# Patient Record
Sex: Male | Born: 1960 | Race: Black or African American | Hispanic: No | Marital: Single | Smoking: Former smoker
Health system: Southern US, Community
[De-identification: ages and names within clinical notes are randomized; demographics above are authoritative.]

## PROBLEM LIST (undated history)

## (undated) DIAGNOSIS — I809 Phlebitis and thrombophlebitis of unspecified site: Secondary | ICD-10-CM

---

## 2002-02-02 ENCOUNTER — Encounter: Payer: Self-pay | Admitting: Emergency Medicine

## 2002-02-02 ENCOUNTER — Emergency Department (HOSPITAL_COMMUNITY): Admission: EM | Admit: 2002-02-02 | Discharge: 2002-02-02 | Payer: Self-pay | Admitting: Emergency Medicine

## 2008-12-13 ENCOUNTER — Emergency Department (HOSPITAL_COMMUNITY): Admission: EM | Admit: 2008-12-13 | Discharge: 2008-12-13 | Payer: Self-pay | Admitting: Emergency Medicine

## 2009-08-28 ENCOUNTER — Emergency Department (HOSPITAL_COMMUNITY): Admission: EM | Admit: 2009-08-28 | Discharge: 2009-08-28 | Payer: Self-pay | Admitting: Emergency Medicine

## 2010-04-20 ENCOUNTER — Emergency Department (HOSPITAL_COMMUNITY)
Admission: EM | Admit: 2010-04-20 | Discharge: 2010-04-20 | Payer: Self-pay | Source: Home / Self Care | Admitting: Emergency Medicine

## 2010-10-09 LAB — WOUND CULTURE

## 2010-10-29 LAB — CULTURE, ROUTINE-ABSCESS

## 2011-02-23 ENCOUNTER — Emergency Department (HOSPITAL_COMMUNITY)
Admission: EM | Admit: 2011-02-23 | Discharge: 2011-02-23 | Disposition: A | Discharge status: Home / Self Care | Payer: Self-pay | Attending: Emergency Medicine | Admitting: Emergency Medicine

## 2011-02-23 ENCOUNTER — Emergency Department (HOSPITAL_COMMUNITY): Payer: Self-pay

## 2011-02-23 DIAGNOSIS — L02219 Cutaneous abscess of trunk, unspecified: Secondary | ICD-10-CM | POA: Insufficient documentation

## 2011-02-23 DIAGNOSIS — L03319 Cellulitis of trunk, unspecified: Secondary | ICD-10-CM | POA: Insufficient documentation

## 2011-02-23 DIAGNOSIS — N509 Disorder of male genital organs, unspecified: Secondary | ICD-10-CM | POA: Insufficient documentation

## 2011-02-23 DIAGNOSIS — R609 Edema, unspecified: Secondary | ICD-10-CM | POA: Insufficient documentation

## 2011-02-23 LAB — CBC
MCHC: 34.7 g/dL (ref 30.0–36.0)
Platelets: 261 10*3/uL (ref 150–400)
RDW: 13.1 % (ref 11.5–15.5)

## 2011-02-23 LAB — POCT I-STAT, CHEM 8
BUN: 17 mg/dL (ref 6–23)
Calcium, Ion: 1.1 mmol/L — ABNORMAL LOW (ref 1.12–1.32)
Glucose, Bld: 107 mg/dL — ABNORMAL HIGH (ref 70–99)
HCT: 43 % (ref 39.0–52.0)
Hemoglobin: 14.6 g/dL (ref 13.0–17.0)
Sodium: 136 mEq/L (ref 135–145)
TCO2: 24 mmol/L (ref 0–100)

## 2011-02-23 LAB — DIFFERENTIAL
Basophils Absolute: 0.1 10*3/uL (ref 0.0–0.1)
Basophils Relative: 0 % (ref 0–1)
Eosinophils Absolute: 0.1 10*3/uL (ref 0.0–0.7)
Lymphocytes Relative: 17 % (ref 12–46)
Lymphs Abs: 2.8 10*3/uL (ref 0.7–4.0)
Monocytes Absolute: 1.4 10*3/uL — ABNORMAL HIGH (ref 0.1–1.0)
Monocytes Relative: 8 % (ref 3–12)
Neutro Abs: 12 10*3/uL — ABNORMAL HIGH (ref 1.7–7.7)
Neutrophils Relative %: 74 % (ref 43–77)

## 2011-02-23 MED ORDER — IOHEXOL 300 MG/ML  SOLN
100.0000 mL | Freq: Once | INTRAMUSCULAR | Status: AC | PRN
  Administered 2011-02-23: 100 mL via INTRAVENOUS

## 2011-09-08 ENCOUNTER — Ambulatory Visit
Admission: RE | Admit: 2011-09-08 | Discharge: 2011-09-08 | Disposition: A | Discharge status: Home / Self Care | Payer: No Typology Code available for payment source | Source: Ambulatory Visit | Attending: Orthopedic Surgery | Admitting: Orthopedic Surgery

## 2011-09-08 ENCOUNTER — Other Ambulatory Visit: Payer: Self-pay | Admitting: Orthopedic Surgery

## 2011-09-08 DIAGNOSIS — M2460 Ankylosis, unspecified joint: Secondary | ICD-10-CM

## 2011-09-08 DIAGNOSIS — M25562 Pain in left knee: Secondary | ICD-10-CM

## 2011-09-08 DIAGNOSIS — M79671 Pain in right foot: Secondary | ICD-10-CM

## 2013-04-14 IMAGING — CR DG FOOT COMPLETE 3+V*R*
3 series · 3 of 3 positions shown · non-contrast
Comparison: None.

CLINICAL DATA: Right ankle and foot pain.  Remote history of
trauma.

RIGHT FOOT COMPLETE - 3+ VIEW

[t foot ap right]
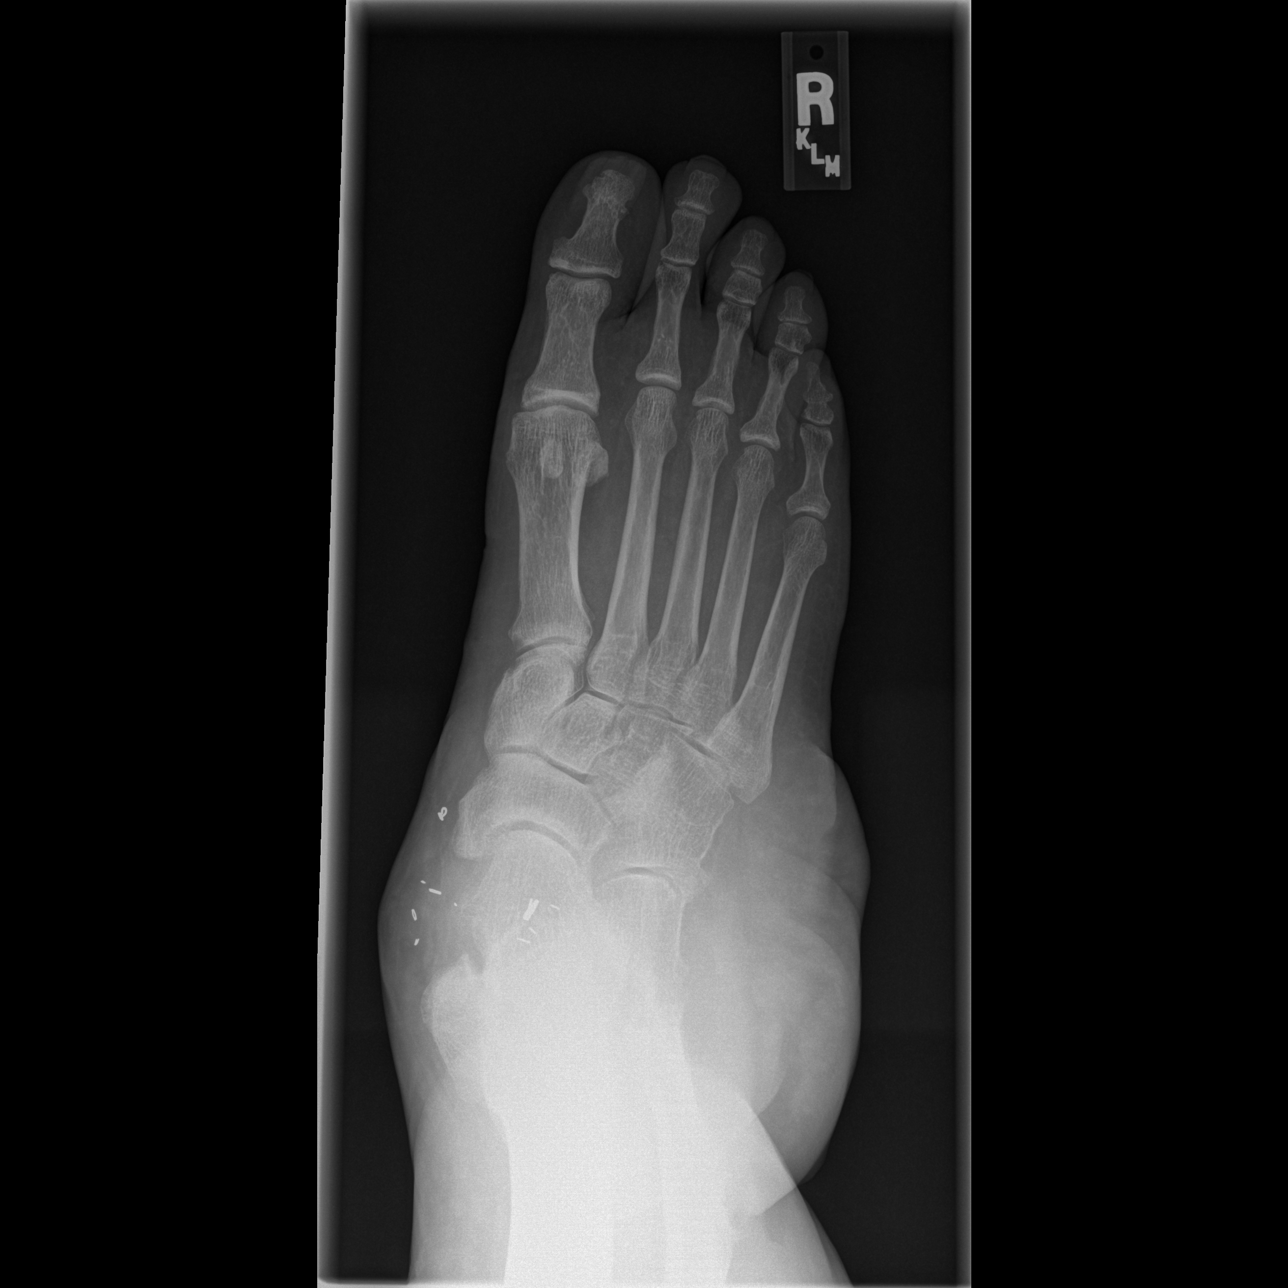

[t foot oblique right]
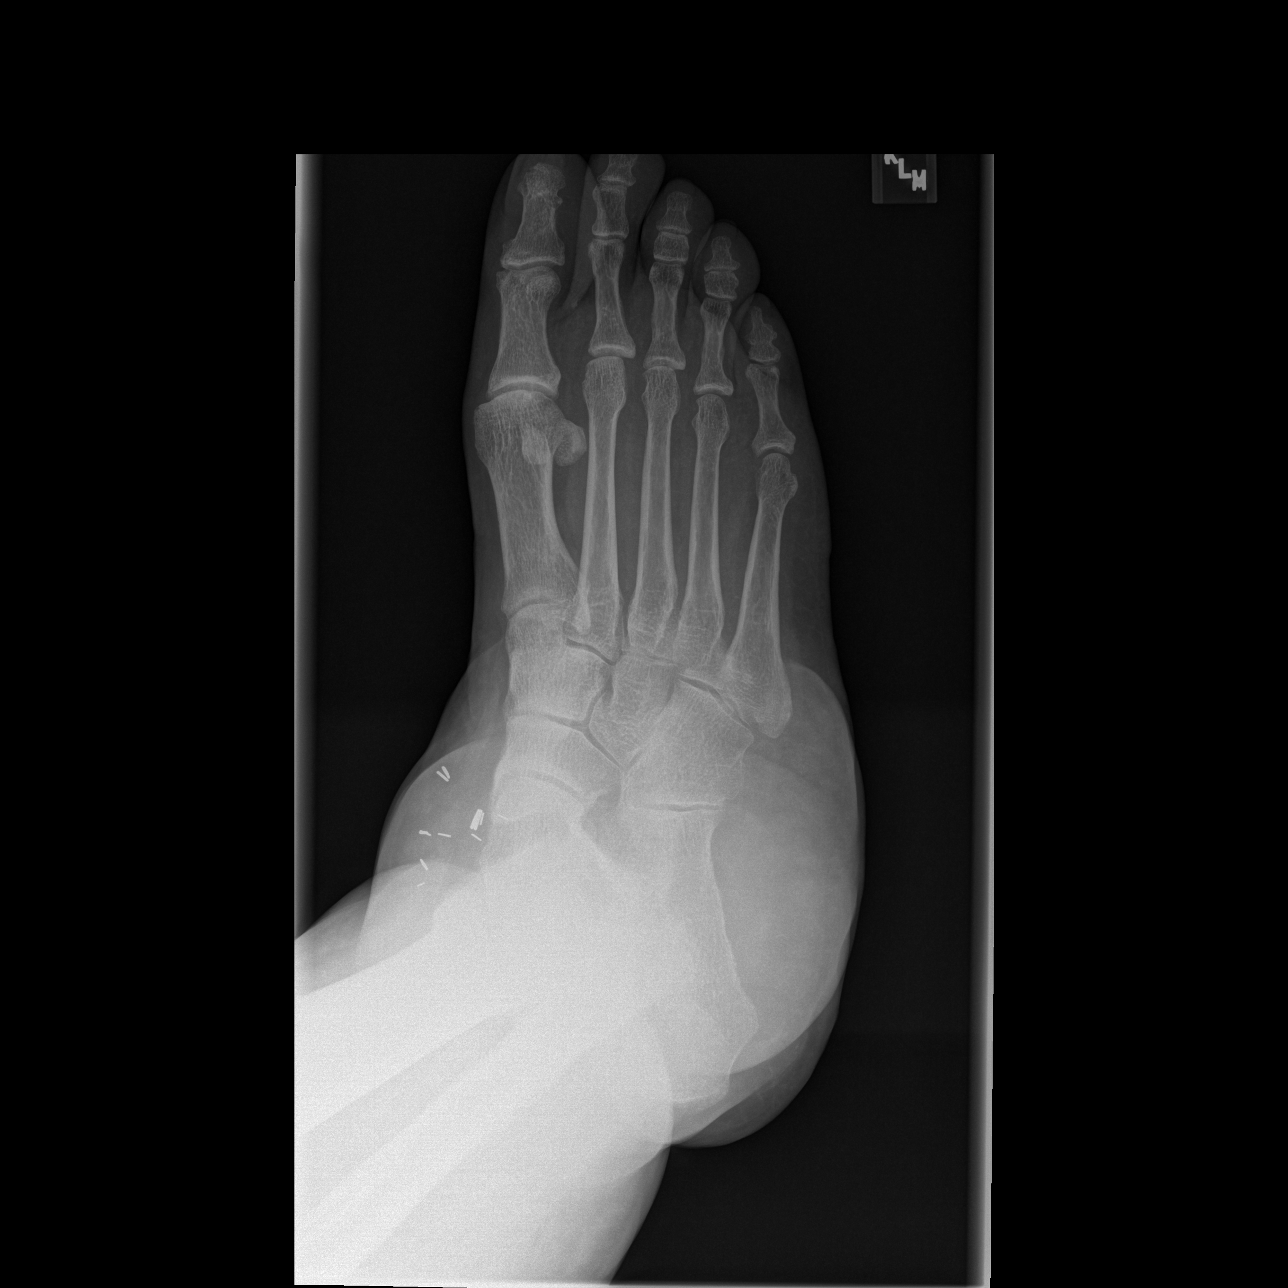

[t foot lat right]
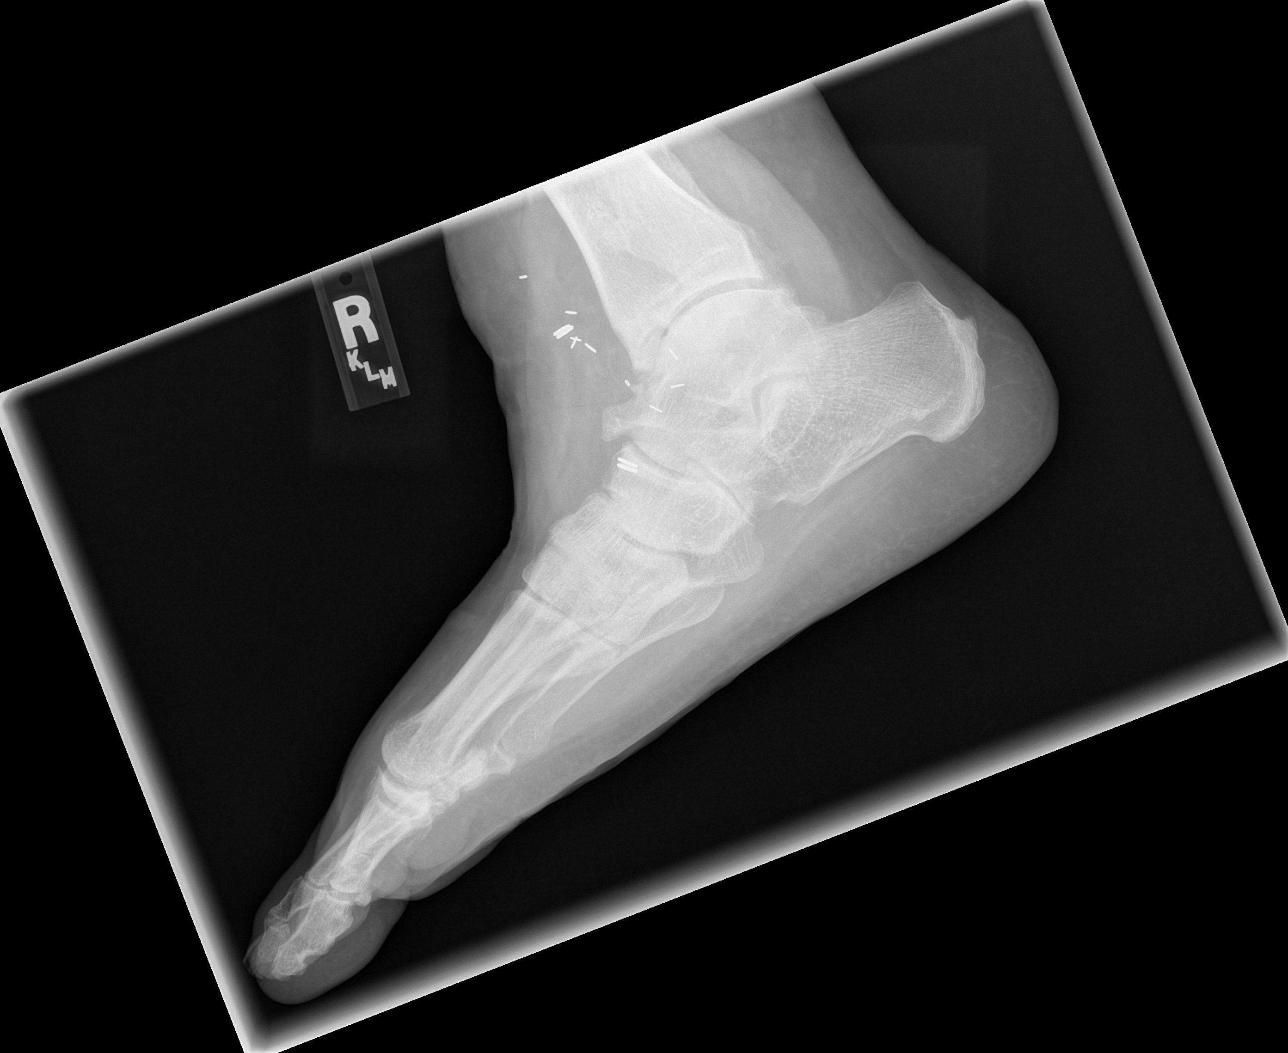

[3 of 3 positions shown; findings below may reference images not displayed]

FINDINGS: Surgical clips about the right ankle.  Post-traumatic
osteoarthritis within the right ankle.  Mild osteoarthritis in the
hind foot.  Mild degenerative changes in the first MTP joint. No
acute bony abnormality.  Specifically, no fracture, subluxation, or
dislocation.  Soft tissues are intact.
IMPRESSION: No acute bony abnormality.

## 2021-12-22 ENCOUNTER — Emergency Department (HOSPITAL_COMMUNITY)
Admission: EM | Admit: 2021-12-22 | Discharge: 2021-12-22 | Disposition: A | Discharge status: Home / Self Care | Payer: Medicaid - Out of State | Attending: Emergency Medicine | Admitting: Emergency Medicine

## 2021-12-22 ENCOUNTER — Encounter (HOSPITAL_COMMUNITY): Payer: Self-pay

## 2021-12-22 ENCOUNTER — Other Ambulatory Visit: Payer: Self-pay

## 2021-12-22 DIAGNOSIS — F10120 Alcohol abuse with intoxication, uncomplicated: Secondary | ICD-10-CM | POA: Insufficient documentation

## 2021-12-22 DIAGNOSIS — R7401 Elevation of levels of liver transaminase levels: Secondary | ICD-10-CM | POA: Insufficient documentation

## 2021-12-22 DIAGNOSIS — L22 Diaper dermatitis: Secondary | ICD-10-CM | POA: Insufficient documentation

## 2021-12-22 DIAGNOSIS — F1410 Cocaine abuse, uncomplicated: Secondary | ICD-10-CM | POA: Diagnosis not present

## 2021-12-22 DIAGNOSIS — R600 Localized edema: Secondary | ICD-10-CM | POA: Insufficient documentation

## 2021-12-22 DIAGNOSIS — F10920 Alcohol use, unspecified with intoxication, uncomplicated: Secondary | ICD-10-CM

## 2021-12-22 DIAGNOSIS — B3789 Other sites of candidiasis: Secondary | ICD-10-CM

## 2021-12-22 DIAGNOSIS — Y908 Blood alcohol level of 240 mg/100 ml or more: Secondary | ICD-10-CM | POA: Insufficient documentation

## 2021-12-22 DIAGNOSIS — R4182 Altered mental status, unspecified: Secondary | ICD-10-CM | POA: Diagnosis not present

## 2021-12-22 LAB — URINALYSIS, ROUTINE W REFLEX MICROSCOPIC
Bilirubin Urine: NEGATIVE
Glucose, UA: NEGATIVE mg/dL
Hgb urine dipstick: NEGATIVE
Ketones, ur: NEGATIVE mg/dL
Leukocytes,Ua: NEGATIVE
Nitrite: NEGATIVE
Protein, ur: NEGATIVE mg/dL
Specific Gravity, Urine: 1.021 (ref 1.005–1.030)
pH: 5 (ref 5.0–8.0)

## 2021-12-22 LAB — CBC WITH DIFFERENTIAL/PLATELET
Abs Immature Granulocytes: 0.03 10*3/uL (ref 0.00–0.07)
Basophils Absolute: 0.1 10*3/uL (ref 0.0–0.1)
Basophils Relative: 1 %
Eosinophils Absolute: 0.1 10*3/uL (ref 0.0–0.5)
Eosinophils Relative: 1 %
HCT: 46.1 % (ref 39.0–52.0)
Hemoglobin: 15.2 g/dL (ref 13.0–17.0)
Immature Granulocytes: 1 %
Lymphocytes Relative: 30 %
Lymphs Abs: 1.4 10*3/uL (ref 0.7–4.0)
MCH: 31.8 pg (ref 26.0–34.0)
MCHC: 33 g/dL (ref 30.0–36.0)
MCV: 96.4 fL (ref 80.0–100.0)
Monocytes Absolute: 0.4 10*3/uL (ref 0.1–1.0)
Monocytes Relative: 8 %
Neutro Abs: 2.8 10*3/uL (ref 1.7–7.7)
Neutrophils Relative %: 59 %
Platelets: 265 10*3/uL (ref 150–400)
RBC: 4.78 MIL/uL (ref 4.22–5.81)
RDW: 16.1 % — ABNORMAL HIGH (ref 11.5–15.5)
WBC: 4.7 10*3/uL (ref 4.0–10.5)
nRBC: 0 % (ref 0.0–0.2)

## 2021-12-22 LAB — COMPREHENSIVE METABOLIC PANEL
ALT: 90 U/L — ABNORMAL HIGH (ref 0–44)
AST: 132 U/L — ABNORMAL HIGH (ref 15–41)
Albumin: 3.8 g/dL (ref 3.5–5.0)
Alkaline Phosphatase: 58 U/L (ref 38–126)
Anion gap: 11 (ref 5–15)
BUN: 7 mg/dL — ABNORMAL LOW (ref 8–23)
CO2: 27 mmol/L (ref 22–32)
Calcium: 8.8 mg/dL — ABNORMAL LOW (ref 8.9–10.3)
Chloride: 106 mmol/L (ref 98–111)
Creatinine, Ser: 0.85 mg/dL (ref 0.61–1.24)
GFR, Estimated: >60 mL/min (ref >60)
Glucose, Bld: 84 mg/dL (ref 70–99)
Potassium: 4.2 mmol/L (ref 3.5–5.1)
Sodium: 144 mmol/L (ref 135–145)
Total Bilirubin: 1.2 mg/dL (ref 0.3–1.2)
Total Protein: 8.2 g/dL — ABNORMAL HIGH (ref 6.5–8.1)

## 2021-12-22 LAB — RAPID URINE DRUG SCREEN, HOSP PERFORMED
Amphetamines: NOT DETECTED
Barbiturates: NOT DETECTED
Benzodiazepines: NOT DETECTED
Cocaine: POSITIVE — AB
Opiates: NOT DETECTED
Tetrahydrocannabinol: NOT DETECTED

## 2021-12-22 LAB — ETHANOL: Alcohol, Ethyl (B): 255 mg/dL — ABNORMAL HIGH (ref <10)

## 2021-12-22 LAB — PROTIME-INR
INR: 1 (ref 0.8–1.2)
Prothrombin Time: 13.4 seconds (ref 11.4–15.2)

## 2021-12-22 LAB — SALICYLATE LEVEL: Salicylate Lvl: <7 mg/dL — ABNORMAL LOW (ref 7.0–30.0)

## 2021-12-22 LAB — ACETAMINOPHEN LEVEL: Acetaminophen (Tylenol), Serum: 23 ug/mL (ref 10–30)

## 2021-12-22 LAB — AMMONIA: Ammonia: 24 umol/L (ref 9–35)

## 2021-12-22 LAB — LIPASE, BLOOD: Lipase: 34 U/L (ref 11–51)

## 2021-12-22 LAB — LACTIC ACID, PLASMA: Lactic Acid, Venous: 3.3 mmol/L (ref 0.5–1.9)

## 2021-12-22 MED ORDER — STERILE WATER FOR INJECTION IJ SOLN
INTRAMUSCULAR | Status: AC
  Filled 2021-12-22: qty 10

## 2021-12-22 MED ORDER — FLUCONAZOLE 100 MG PO TABS: 100.0000 mg | ORAL_TABLET | Freq: Every day | ORAL | 0 refills | Status: AC

## 2021-12-22 MED ORDER — ZIPRASIDONE MESYLATE 20 MG IM SOLR
10.0000 mg | Freq: Once | INTRAMUSCULAR | Status: AC
  Administered 2021-12-22: 10 mg via INTRAMUSCULAR
  Filled 2021-12-22: qty 20

## 2021-12-22 MED ORDER — LACTATED RINGERS IV BOLUS
500.0000 mL | Freq: Once | INTRAVENOUS | Status: AC
  Administered 2021-12-22: 500 mL via INTRAVENOUS

## 2021-12-22 NOTE — ED Triage Notes (Signed)
Patient states that he doesn't know why he came to ED today. per spouse who only dropped off patient he is having groin pain and is an alcoholic. Patient doesn't know why here. Abdomen appears large denies pain to same
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
## 2021-12-22 NOTE — ED Notes (Signed)
Pt denies any complaints. States he has no idea why he is here. Pt admits to drinking etoh today. Denies SI/HI to this RN.

## 2021-12-22 NOTE — ED Notes (Signed)
Provider aware of LA

## 2021-12-22 NOTE — ED Provider Notes (Signed)
Patient is a 61 year old male presenting for an unclear reason, he endorses heavy alcohol use, he has absolutely no complaints, there was a possible report of groin pain however on my exam this patient has normal vital signs a soft nontender abdomen, clear heart and lung sounds, bilateral symmetrical lower extremity edema which she states is chronic and what appears to be a candidal infection in the left inguinal region which is wet nontender and there is no associated lymphadenopathy.  Medical screening examination/treatment/procedure(s) were conducted as a shared visit with non-physician practitioner(s) and myself.  I personally evaluated the patient during the encounter.  Clinical Impression:   Final diagnoses:  None         Eber Hong, MD 12/23/21 1228

## 2021-12-22 NOTE — ED Notes (Signed)
Unable to obtain labs 
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
## 2021-12-22 NOTE — ED Notes (Signed)
Call michele moore when ready for discharged [phone]

## 2021-12-22 NOTE — Discharge Instructions (Addendum)
The rash over your groin is consistent with a yeast infection.  Please start fluconazole, 1 tablet daily for 2 weeks.

## 2022-02-16 ENCOUNTER — Emergency Department (HOSPITAL_BASED_OUTPATIENT_CLINIC_OR_DEPARTMENT_OTHER)
Admission: EM | Admit: 2022-02-16 | Discharge: 2022-02-16 | Discharge status: Against Medical Advice | Payer: Medicaid Other | Attending: Emergency Medicine | Admitting: Emergency Medicine

## 2022-02-16 ENCOUNTER — Ambulatory Visit
Admission: EM | Admit: 2022-02-16 | Discharge: 2022-02-16 | Disposition: A | Discharge status: Home / Self Care | Payer: Medicaid Other

## 2022-02-16 ENCOUNTER — Emergency Department (HOSPITAL_BASED_OUTPATIENT_CLINIC_OR_DEPARTMENT_OTHER): Payer: Medicaid Other

## 2022-02-16 ENCOUNTER — Other Ambulatory Visit: Payer: Self-pay

## 2022-02-16 ENCOUNTER — Ambulatory Visit: Payer: Self-pay

## 2022-02-16 ENCOUNTER — Emergency Department (HOSPITAL_BASED_OUTPATIENT_CLINIC_OR_DEPARTMENT_OTHER): Payer: Medicaid Other | Admitting: Radiology

## 2022-02-16 DIAGNOSIS — Z7901 Long term (current) use of anticoagulants: Secondary | ICD-10-CM | POA: Insufficient documentation

## 2022-02-16 DIAGNOSIS — M86261 Subacute osteomyelitis, right tibia and fibula: Secondary | ICD-10-CM | POA: Diagnosis not present

## 2022-02-16 DIAGNOSIS — I2699 Other pulmonary embolism without acute cor pulmonale: Secondary | ICD-10-CM

## 2022-02-16 DIAGNOSIS — S91301S Unspecified open wound, right foot, sequela: Secondary | ICD-10-CM

## 2022-02-16 DIAGNOSIS — M7989 Other specified soft tissue disorders: Secondary | ICD-10-CM | POA: Diagnosis present

## 2022-02-16 DIAGNOSIS — I872 Venous insufficiency (chronic) (peripheral): Secondary | ICD-10-CM

## 2022-02-16 DIAGNOSIS — I809 Phlebitis and thrombophlebitis of unspecified site: Secondary | ICD-10-CM

## 2022-02-16 HISTORY — DX: Phlebitis and thrombophlebitis of unspecified site: I80.9

## 2022-02-16 LAB — COMPREHENSIVE METABOLIC PANEL
ALT: 24 U/L (ref 0–44)
AST: 28 U/L (ref 15–41)
Albumin: 4.1 g/dL (ref 3.5–5.0)
Alkaline Phosphatase: 56 U/L (ref 38–126)
Anion gap: 10 (ref 5–15)
BUN: 8 mg/dL (ref 8–23)
CO2: 28 mmol/L (ref 22–32)
Calcium: 9.9 mg/dL (ref 8.9–10.3)
Chloride: 107 mmol/L (ref 98–111)
Creatinine, Ser: 0.86 mg/dL (ref 0.61–1.24)
GFR, Estimated: >60 mL/min (ref >60)
Glucose, Bld: 109 mg/dL — ABNORMAL HIGH (ref 70–99)
Potassium: 3.9 mmol/L (ref 3.5–5.1)
Sodium: 145 mmol/L (ref 135–145)
Total Bilirubin: 0.7 mg/dL (ref 0.3–1.2)
Total Protein: 8.5 g/dL — ABNORMAL HIGH (ref 6.5–8.1)

## 2022-02-16 LAB — URINALYSIS, ROUTINE W REFLEX MICROSCOPIC
Bilirubin Urine: NEGATIVE
Glucose, UA: NEGATIVE mg/dL
Hgb urine dipstick: NEGATIVE
Ketones, ur: NEGATIVE mg/dL
Leukocytes,Ua: NEGATIVE
Nitrite: NEGATIVE
Protein, ur: NEGATIVE mg/dL
Specific Gravity, Urine: 1.018 (ref 1.005–1.030)
pH: 6 (ref 5.0–8.0)

## 2022-02-16 LAB — CBC WITH DIFFERENTIAL/PLATELET
Abs Immature Granulocytes: 0.03 10*3/uL (ref 0.00–0.07)
Basophils Absolute: 0.1 10*3/uL (ref 0.0–0.1)
Basophils Relative: 2 %
Eosinophils Absolute: 0.1 10*3/uL (ref 0.0–0.5)
Eosinophils Relative: 3 %
HCT: 44.8 % (ref 39.0–52.0)
Hemoglobin: 14.9 g/dL (ref 13.0–17.0)
Immature Granulocytes: 1 %
Lymphocytes Relative: 41 %
Lymphs Abs: 1.4 10*3/uL (ref 0.7–4.0)
MCH: 31.9 pg (ref 26.0–34.0)
MCHC: 33.3 g/dL (ref 30.0–36.0)
MCV: 95.9 fL (ref 80.0–100.0)
Monocytes Absolute: 0.3 10*3/uL (ref 0.1–1.0)
Monocytes Relative: 9 %
Neutro Abs: 1.6 10*3/uL — ABNORMAL LOW (ref 1.7–7.7)
Neutrophils Relative %: 44 %
Platelets: 301 10*3/uL (ref 150–400)
RBC: 4.67 MIL/uL (ref 4.22–5.81)
RDW: 14.5 % (ref 11.5–15.5)
WBC: 3.4 10*3/uL — ABNORMAL LOW (ref 4.0–10.5)
nRBC: 0 % (ref 0.0–0.2)

## 2022-02-16 LAB — PROTIME-INR
INR: 0.9 (ref 0.8–1.2)
Prothrombin Time: 12.1 seconds (ref 11.4–15.2)

## 2022-02-16 LAB — LACTIC ACID, PLASMA
Lactic Acid, Venous: 1.8 mmol/L (ref 0.5–1.9)
Lactic Acid, Venous: 1.6 mmol/L (ref 0.5–1.9)

## 2022-02-16 MED ORDER — PIPERACILLIN-TAZOBACTAM 3.375 G IVPB
3.3750 g | Freq: Once | INTRAVENOUS | Status: AC
  Administered 2022-02-16: 3.375 g via INTRAVENOUS
  Filled 2022-02-16: qty 50

## 2022-02-16 MED ORDER — DOXYCYCLINE HYCLATE 100 MG PO CAPS: 100.0000 mg | ORAL_CAPSULE | Freq: Two times a day (BID) | ORAL | 0 refills | Status: AC

## 2022-02-16 MED ORDER — SODIUM CHLORIDE 0.9 % IV BOLUS
500.0000 mL | Freq: Once | INTRAVENOUS | Status: AC
  Administered 2022-02-16: 500 mL via INTRAVENOUS

## 2022-02-16 MED ORDER — CEPHALEXIN 500 MG PO CAPS: 500.0000 mg | ORAL_CAPSULE | Freq: Four times a day (QID) | ORAL | 0 refills | Status: AC

## 2022-02-16 MED ORDER — VANCOMYCIN HCL IN DEXTROSE 1-5 GM/200ML-% IV SOLN
1000.0000 mg | Freq: Once | INTRAVENOUS | Status: AC
  Administered 2022-02-16: 1000 mg via INTRAVENOUS
  Filled 2022-02-16: qty 200

## 2022-02-16 NOTE — ED Triage Notes (Signed)
Patient arrives with complaints of worsening right foot wound x1 month. Patient's significant other states that the site has been draining with foul odor.   Patient reports no pain.

## 2022-02-16 NOTE — ED Notes (Signed)
Patient is being discharged from the Urgent Care and sent to the Emergency Department via private vehicle . Per L. Scales PA, patient is in need of higher level of care due to infected wound. Patient is aware and verbalizes understanding of plan of care.  Vitals:   02/16/22 1012  BP: 107/76  Pulse: (!) 107  Resp: 16  Temp: 98.2 F (36.8 C)  SpO2: 97%

## 2022-02-16 NOTE — ED Notes (Signed)
Pts sock removed advanced wound  with 2 open areas that are draining scant amount of yellow exudate. Foot has a foul odor.

## 2022-02-16 NOTE — ED Triage Notes (Signed)
Pt states open wound to rt foot for over a year.
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
## 2022-02-16 NOTE — Discharge Instructions (Addendum)
You were seen in the emergency department today for an infection of a wound on your foot.  You do have an infection of the bone.  You have decided not to stay in the hospital to receive IV antibiotics.  We believe that that is the proper course of treatment for osteomyelitis or an infection of the bone.  Alternatively he would like to leave and receive oral antibiotics.  Again the risks of this include worsening of the infection, infection spreading to your blood with subsequent sepsis, needing amputation of your foot or leg, permanent disability and death.  Please return to the emergency department for any worsening of your symptoms.

## 2022-02-21 LAB — CULTURE, BLOOD (ROUTINE X 2)
Culture: NO GROWTH
Culture: NO GROWTH
Special Requests: ADEQUATE

## 2022-11-17 ENCOUNTER — Ambulatory Visit (INDEPENDENT_AMBULATORY_CARE_PROVIDER_SITE_OTHER): Payer: Medicaid Other | Admitting: Internal Medicine

## 2022-11-17 ENCOUNTER — Other Ambulatory Visit: Payer: Self-pay

## 2022-11-17 ENCOUNTER — Encounter: Payer: Self-pay | Admitting: Internal Medicine

## 2022-11-17 VITALS — BP 155/93 | HR 89 | Temp 98.1°F | Resp 28 | Ht 71.0 in | Wt 240.4 lb

## 2022-11-17 DIAGNOSIS — R030 Elevated blood-pressure reading, without diagnosis of hypertension: Secondary | ICD-10-CM

## 2022-11-17 DIAGNOSIS — E785 Hyperlipidemia, unspecified: Secondary | ICD-10-CM

## 2022-11-17 DIAGNOSIS — I2699 Other pulmonary embolism without acute cor pulmonale: Secondary | ICD-10-CM | POA: Diagnosis present

## 2022-11-17 DIAGNOSIS — G4700 Insomnia, unspecified: Secondary | ICD-10-CM | POA: Diagnosis not present

## 2022-11-17 DIAGNOSIS — I10 Essential (primary) hypertension: Secondary | ICD-10-CM

## 2022-11-17 DIAGNOSIS — Z0000 Encounter for general adult medical examination without abnormal findings: Secondary | ICD-10-CM

## 2022-11-17 MED ORDER — APIXABAN 5 MG PO TABS: 5.0000 mg | ORAL_TABLET | Freq: Two times a day (BID) | ORAL | 1 refills | Status: DC

## 2022-11-17 MED ORDER — QUETIAPINE FUMARATE 100 MG PO TABS: 100.0000 mg | ORAL_TABLET | Freq: Every day | ORAL | 0 refills | Status: DC
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
## 2022-11-17 NOTE — Patient Instructions (Addendum)
[name], it was a pleasure seeing you today! You endorsed feeling well today. Below are some of the things we talked about this visit. We look forward to seeing you in the follow up appointment!  Today we discussed: We will refill your seroquel. I want you to start eliquis 5 mg twice daily back.  Please check your blood pressure and bring a log to the next visit.  We will check lab work.  I have ordered the following labs today:  Lab Orders         CMP14 + Anion Gap         Lipid Profile         TSH        Referrals ordered today:   Referral Orders  No referral(s) requested today     I have ordered the following medication/changed the following medications:   Stop the following medications: Medications Discontinued During This Encounter  Medication Reason   apixaban (ELIQUIS) 5 MG TABS tablet Reorder     Start the following medications: Meds ordered this encounter  Medications   apixaban (ELIQUIS) 5 MG TABS tablet    Sig: Take 1 tablet (5 mg total) by mouth 2 (two) times daily.    Dispense:  90 tablet    Refill:  1   QUEtiapine (SEROQUEL) 100 MG tablet    Sig: Take 1 tablet (100 mg total) by mouth at bedtime.    Dispense:  90 tablet    Refill:  0     Follow-up: 1 month high blood pressure follow up   Please make sure to arrive 15 minutes prior to your next appointment. If you arrive late, you may be asked to reschedule.   We look forward to seeing you next time. Please call our clinic at [phone] if you have any questions or concerns. The best time to call is Monday-Friday from 9am-4pm, but there is someone available 24/7. If after hours or the weekend, call the main hospital number and ask for the Internal Medicine Resident On-Call. If you need medication refills, please notify your pharmacy one week in advance and they will send [place] a request.  Thank you for letting us take part in your care. Wishing you the best!  Thank you, Gwenevere Abbot, MD

## 2022-11-18 ENCOUNTER — Telehealth: Payer: Self-pay

## 2022-11-18 LAB — LIPID PANEL
Chol/HDL Ratio: 2.1 ratio (ref 0.0–5.0)
Cholesterol, Total: 139 mg/dL (ref 100–199)
HDL: 66 mg/dL (ref >39)
LDL Chol Calc (NIH): 57 mg/dL (ref 0–99)
Triglycerides: 86 mg/dL (ref 0–149)
VLDL Cholesterol Cal: 16 mg/dL (ref 5–40)

## 2022-11-18 LAB — CMP14 + ANION GAP
ALT: 14 IU/L (ref 0–44)
AST: 19 IU/L (ref 0–40)
Albumin/Globulin Ratio: 1.3 (ref 1.2–2.2)
Albumin: 4.4 g/dL (ref 3.9–4.9)
Alkaline Phosphatase: 69 IU/L (ref 44–121)
Anion Gap: 17 mmol/L (ref 10.0–18.0)
BUN/Creatinine Ratio: 15 (ref 10–24)
BUN: 14 mg/dL (ref 8–27)
Bilirubin Total: 1.7 mg/dL — ABNORMAL HIGH (ref 0.0–1.2)
CO2: 23 mmol/L (ref 20–29)
Calcium: 9.7 mg/dL (ref 8.6–10.2)
Chloride: 103 mmol/L (ref 96–106)
Creatinine, Ser: 0.91 mg/dL (ref 0.76–1.27)
Globulin, Total: 3.5 g/dL (ref 1.5–4.5)
Glucose: 88 mg/dL (ref 70–99)
Potassium: 4.9 mmol/L (ref 3.5–5.2)
Sodium: 143 mmol/L (ref 134–144)
Total Protein: 7.9 g/dL (ref 6.0–8.5)
eGFR: 95 mL/min/{1.73_m2} (ref >59)

## 2022-11-18 LAB — TSH: TSH: 3.27 u[IU]/mL (ref 0.450–4.500)

## 2022-11-18 NOTE — Telephone Encounter (Signed)
Decision:Approved The pharmacy department has reviewed the request submitted by John Mcguire for the following medications: Drug:Quetiapine Fumarate oral tablet 100 MG is approved from 11/18/2022-11/18/2023. All strengths of the drug are approved.  Approval has been faxed to the pharmacy

## 2022-11-18 NOTE — Telephone Encounter (Signed)
Prior Authorization for patient (Quetiapine Fumarate) came through on cover my meds was submitted awaiting approval or denial

## 2022-11-20 ENCOUNTER — Encounter: Payer: Self-pay | Admitting: Internal Medicine

## 2022-11-20 DIAGNOSIS — Z0000 Encounter for general adult medical examination without abnormal findings: Secondary | ICD-10-CM | POA: Insufficient documentation

## 2022-11-20 DIAGNOSIS — G4700 Insomnia, unspecified: Secondary | ICD-10-CM | POA: Insufficient documentation

## 2022-11-20 DIAGNOSIS — R030 Elevated blood-pressure reading, without diagnosis of hypertension: Secondary | ICD-10-CM | POA: Insufficient documentation

## 2022-11-20 NOTE — Assessment & Plan Note (Addendum)
Pt states he had his colonoscopy in [place] within the last 10 years. Will try to obtain records before ordering another one.   Will discuss low dose cancer screening at follow up given 37 pack year smoking hx.
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
## 2022-11-20 NOTE — Assessment & Plan Note (Addendum)
Pt with elevated BP reading. He is not on any medications; normotensive readings sporadically. But elevated diastolic reading previously. Pt has a BP monitor at home and will advise checking his blood pressures at home and bringing them to the next visit. If log shows elevated reading, or next office visit with elevated reading, we can start antihypertensive. 1 month follow up recommended.

## 2022-11-20 NOTE — Assessment & Plan Note (Addendum)
Pt reports long hx of insomnia. Has been seen by Psychiatry and is on Seroquel 100 mg qhs. When inquiring about his sleep routine, pt  has a poor sleep hygiene. He has a TV in his bedroom and sleep with around 11 pm. If he is unable to sleep he flips through channels until he does. Advised we will request his previous records, and prescribe him Seroquel, with goal of gradually switching to a different medication. Record release form signed. PHQ9 and GAD 7 formed filled out and normal. TSH ordered and normal.

## 2022-11-26 NOTE — Progress Notes (Signed)
Internal Medicine Clinic Attending  Case discussed with Dr. Khan  At the time of the visit.  We reviewed the resident's history and exam and pertinent patient test results.  I agree with the assessment, diagnosis, and plan of care documented in the resident's note.  

## 2023-01-08 ENCOUNTER — Other Ambulatory Visit: Payer: Self-pay | Admitting: Internal Medicine

## 2023-01-08 DIAGNOSIS — G4700 Insomnia, unspecified: Secondary | ICD-10-CM

## 2023-04-11 ENCOUNTER — Other Ambulatory Visit: Payer: Self-pay | Admitting: Internal Medicine

## 2023-04-11 DIAGNOSIS — G4700 Insomnia, unspecified: Secondary | ICD-10-CM

## 2023-04-11 DIAGNOSIS — I2699 Other pulmonary embolism without acute cor pulmonale: Secondary | ICD-10-CM

## 2023-07-27 ENCOUNTER — Other Ambulatory Visit: Payer: Self-pay | Admitting: Internal Medicine

## 2023-07-27 DIAGNOSIS — G4700 Insomnia, unspecified: Secondary | ICD-10-CM

## 2023-07-28 ENCOUNTER — Telehealth: Payer: Medicaid Other | Admitting: Internal Medicine

## 2023-09-07 ENCOUNTER — Other Ambulatory Visit: Payer: Self-pay | Admitting: Internal Medicine

## 2023-09-07 ENCOUNTER — Encounter: Payer: Medicaid Other | Admitting: Student

## 2023-09-07 DIAGNOSIS — G4700 Insomnia, unspecified: Secondary | ICD-10-CM

## 2023-09-07 NOTE — Telephone Encounter (Signed)
 LOV 11/17/22.  Appt today was cancelled per pt.

## 2023-09-14 ENCOUNTER — Other Ambulatory Visit: Payer: Self-pay

## 2023-09-14 DIAGNOSIS — I2699 Other pulmonary embolism without acute cor pulmonale: Secondary | ICD-10-CM

## 2023-09-14 MED ORDER — APIXABAN 5 MG PO TABS: 5.0000 mg | ORAL_TABLET | Freq: Two times a day (BID) | ORAL | 1 refills | Status: AC

## 2023-09-14 NOTE — Addendum Note (Signed)
 Addended by: Derrell Lolling on: 09/14/2023 03:23 PM   Modules accepted: Orders

## 2023-09-14 NOTE — Telephone Encounter (Addendum)
 Marland Kitchen

## 2023-09-28 ENCOUNTER — Encounter: Payer: Medicaid Other | Admitting: Student

## 2023-10-07 ENCOUNTER — Other Ambulatory Visit: Payer: Self-pay | Admitting: Student

## 2023-10-07 DIAGNOSIS — G4700 Insomnia, unspecified: Secondary | ICD-10-CM

## 2023-10-08 NOTE — Telephone Encounter (Signed)
 I called the patient to schedule a appointment with [place] in order for [place] to continue to refill his medications, patient stated he will give [place] a call to schedule a appointment.

## 2023-10-22 ENCOUNTER — Other Ambulatory Visit: Payer: Self-pay | Admitting: Internal Medicine

## 2023-10-22 DIAGNOSIS — G4700 Insomnia, unspecified: Secondary | ICD-10-CM

## 2023-10-26 NOTE — Telephone Encounter (Signed)
 I called the patient to schedule a follow up appointment, unable to reach him, unable to leave a voicemail.

## 2023-11-03 ENCOUNTER — Other Ambulatory Visit: Payer: Self-pay | Admitting: Internal Medicine

## 2023-11-03 NOTE — Progress Notes (Signed)
 Called pt and discussed with him the need for in person evaluation to give continued medication refills. He endorsed understanding and stated he will make an appointment. Will give him refill of his Seroquel for one month.   Jackolyn Masker, MD Tommas Fragmin. [facility] Internal Medicine Residency, PGY-3

## 2023-11-15 ENCOUNTER — Other Ambulatory Visit: Payer: Self-pay | Admitting: Internal Medicine

## 2023-11-15 DIAGNOSIS — G4700 Insomnia, unspecified: Secondary | ICD-10-CM

## 2024-01-19 ENCOUNTER — Ambulatory Visit: Payer: Self-pay | Admitting: Student

## 2024-02-25 ENCOUNTER — Other Ambulatory Visit: Payer: Self-pay

## 2024-05-09 ENCOUNTER — Encounter (HOSPITAL_BASED_OUTPATIENT_CLINIC_OR_DEPARTMENT_OTHER): Admitting: General Surgery

## 2024-05-31 ENCOUNTER — Encounter (HOSPITAL_BASED_OUTPATIENT_CLINIC_OR_DEPARTMENT_OTHER): Admitting: General Surgery

## 2024-06-22 ENCOUNTER — Encounter (HOSPITAL_BASED_OUTPATIENT_CLINIC_OR_DEPARTMENT_OTHER): Admitting: General Surgery

## 2024-06-22 DIAGNOSIS — I872 Venous insufficiency (chronic) (peripheral): Secondary | ICD-10-CM | POA: Diagnosis not present

## 2024-06-22 DIAGNOSIS — L97519 Non-pressure chronic ulcer of other part of right foot with unspecified severity: Secondary | ICD-10-CM | POA: Insufficient documentation

## 2024-06-22 DIAGNOSIS — Z945 Skin transplant status: Secondary | ICD-10-CM | POA: Insufficient documentation

## 2024-06-22 DIAGNOSIS — Z86718 Personal history of other venous thrombosis and embolism: Secondary | ICD-10-CM | POA: Diagnosis not present

## 2024-06-22 DIAGNOSIS — L97819 Non-pressure chronic ulcer of other part of right lower leg with unspecified severity: Secondary | ICD-10-CM | POA: Insufficient documentation

## 2024-06-29 ENCOUNTER — Encounter (HOSPITAL_BASED_OUTPATIENT_CLINIC_OR_DEPARTMENT_OTHER): Admitting: Internal Medicine

## 2024-06-29 DIAGNOSIS — L97519 Non-pressure chronic ulcer of other part of right foot with unspecified severity: Secondary | ICD-10-CM | POA: Diagnosis not present

## 2024-07-07 ENCOUNTER — Encounter (HOSPITAL_BASED_OUTPATIENT_CLINIC_OR_DEPARTMENT_OTHER): Admitting: General Surgery

## 2024-07-07 DIAGNOSIS — A4189 Other specified sepsis: Principal | ICD-10-CM | POA: Diagnosis present

## 2024-07-07 DIAGNOSIS — Z79899 Other long term (current) drug therapy: Secondary | ICD-10-CM

## 2024-07-07 DIAGNOSIS — R6520 Severe sepsis without septic shock: Secondary | ICD-10-CM | POA: Diagnosis present

## 2024-07-07 DIAGNOSIS — I110 Hypertensive heart disease with heart failure: Secondary | ICD-10-CM | POA: Diagnosis present

## 2024-07-07 DIAGNOSIS — R918 Other nonspecific abnormal finding of lung field: Secondary | ICD-10-CM | POA: Diagnosis present

## 2024-07-07 DIAGNOSIS — J440 Chronic obstructive pulmonary disease with acute lower respiratory infection: Secondary | ICD-10-CM | POA: Diagnosis present

## 2024-07-07 DIAGNOSIS — F5104 Psychophysiologic insomnia: Secondary | ICD-10-CM | POA: Diagnosis present

## 2024-07-07 DIAGNOSIS — J441 Chronic obstructive pulmonary disease with (acute) exacerbation: Secondary | ICD-10-CM | POA: Diagnosis present

## 2024-07-07 DIAGNOSIS — F1010 Alcohol abuse, uncomplicated: Secondary | ICD-10-CM | POA: Diagnosis present

## 2024-07-07 DIAGNOSIS — Z888 Allergy status to other drugs, medicaments and biological substances status: Secondary | ICD-10-CM

## 2024-07-07 DIAGNOSIS — Z716 Tobacco abuse counseling: Secondary | ICD-10-CM

## 2024-07-07 DIAGNOSIS — I5033 Acute on chronic diastolic (congestive) heart failure: Secondary | ICD-10-CM | POA: Diagnosis present

## 2024-07-07 DIAGNOSIS — Z86718 Personal history of other venous thrombosis and embolism: Secondary | ICD-10-CM

## 2024-07-07 DIAGNOSIS — Z5320 Procedure and treatment not carried out because of patient's decision for unspecified reasons: Secondary | ICD-10-CM | POA: Diagnosis present

## 2024-07-07 DIAGNOSIS — Z86711 Personal history of pulmonary embolism: Secondary | ICD-10-CM

## 2024-07-07 DIAGNOSIS — E872 Acidosis, unspecified: Secondary | ICD-10-CM | POA: Diagnosis present

## 2024-07-07 DIAGNOSIS — I878 Other specified disorders of veins: Secondary | ICD-10-CM | POA: Diagnosis present

## 2024-07-07 DIAGNOSIS — R7401 Elevation of levels of liver transaminase levels: Secondary | ICD-10-CM | POA: Diagnosis present

## 2024-07-07 DIAGNOSIS — F17200 Nicotine dependence, unspecified, uncomplicated: Secondary | ICD-10-CM | POA: Diagnosis present

## 2024-07-07 DIAGNOSIS — Z883 Allergy status to other anti-infective agents status: Secondary | ICD-10-CM

## 2024-07-07 DIAGNOSIS — F1411 Cocaine abuse, in remission: Secondary | ICD-10-CM | POA: Diagnosis present

## 2024-07-07 DIAGNOSIS — Z66 Do not resuscitate: Secondary | ICD-10-CM | POA: Diagnosis present

## 2024-07-07 DIAGNOSIS — J9601 Acute respiratory failure with hypoxia: Secondary | ICD-10-CM | POA: Diagnosis present

## 2024-07-07 DIAGNOSIS — J1001 Influenza due to other identified influenza virus with the same other identified influenza virus pneumonia: Secondary | ICD-10-CM | POA: Diagnosis present

## 2024-07-07 DIAGNOSIS — Z7901 Long term (current) use of anticoagulants: Secondary | ICD-10-CM

## 2024-07-09 ENCOUNTER — Emergency Department (HOSPITAL_COMMUNITY)

## 2024-07-09 ENCOUNTER — Other Ambulatory Visit: Payer: Self-pay

## 2024-07-09 ENCOUNTER — Encounter (HOSPITAL_COMMUNITY): Payer: Self-pay

## 2024-07-09 ENCOUNTER — Inpatient Hospital Stay (HOSPITAL_COMMUNITY)
Admission: EM | Admit: 2024-07-09 | Discharge: 2024-07-16 | Disposition: A | Discharge status: Home / Self Care | Attending: Internal Medicine | Admitting: Internal Medicine

## 2024-07-09 DIAGNOSIS — J9601 Acute respiratory failure with hypoxia: Principal | ICD-10-CM | POA: Insufficient documentation

## 2024-07-09 DIAGNOSIS — R911 Solitary pulmonary nodule: Secondary | ICD-10-CM

## 2024-07-09 DIAGNOSIS — R7401 Elevation of levels of liver transaminase levels: Secondary | ICD-10-CM

## 2024-07-09 DIAGNOSIS — J101 Influenza due to other identified influenza virus with other respiratory manifestations: Secondary | ICD-10-CM | POA: Diagnosis not present

## 2024-07-09 DIAGNOSIS — J111 Influenza due to unidentified influenza virus with other respiratory manifestations: Secondary | ICD-10-CM

## 2024-07-09 DIAGNOSIS — R0603 Acute respiratory distress: Secondary | ICD-10-CM

## 2024-07-09 DIAGNOSIS — A419 Sepsis, unspecified organism: Secondary | ICD-10-CM

## 2024-07-09 DIAGNOSIS — R6520 Severe sepsis without septic shock: Secondary | ICD-10-CM

## 2024-07-09 LAB — I-STAT CHEM 8, ED
BUN: 14 mg/dL (ref 8–23)
Calcium, Ion: 1.08 mmol/L — ABNORMAL LOW (ref 1.15–1.40)
Chloride: 100 mmol/L (ref 98–111)
Creatinine, Ser: 1.1 mg/dL (ref 0.61–1.24)
Glucose, Bld: 95 mg/dL (ref 70–99)
HCT: 49 % (ref 39.0–52.0)
Hemoglobin: 16.7 g/dL (ref 13.0–17.0)
Potassium: 4.1 mmol/L (ref 3.5–5.1)
Sodium: 138 mmol/L (ref 135–145)
TCO2: 25 mmol/L (ref 22–32)

## 2024-07-09 LAB — CBC WITH DIFFERENTIAL/PLATELET
Abs Immature Granulocytes: 0.05 K/uL (ref 0.00–0.07)
Basophils Absolute: 0 K/uL (ref 0.0–0.1)
Basophils Relative: 0 %
Eosinophils Absolute: 0 K/uL (ref 0.0–0.5)
Eosinophils Relative: 0 %
HCT: 46.6 % (ref 39.0–52.0)
Hemoglobin: 15.3 g/dL (ref 13.0–17.0)
Immature Granulocytes: 1 %
Lymphocytes Relative: 10 %
Lymphs Abs: 0.5 K/uL — ABNORMAL LOW (ref 0.7–4.0)
MCH: 32.4 pg (ref 26.0–34.0)
MCHC: 32.8 g/dL (ref 30.0–36.0)
MCV: 98.7 fL (ref 80.0–100.0)
Monocytes Absolute: 0.8 K/uL (ref 0.1–1.0)
Monocytes Relative: 15 %
Neutro Abs: 4.2 K/uL (ref 1.7–7.7)
Neutrophils Relative %: 74 %
Platelets: 188 K/uL (ref 150–400)
RBC: 4.72 MIL/uL (ref 4.22–5.81)
RDW: 14 % (ref 11.5–15.5)
WBC: 5.6 K/uL (ref 4.0–10.5)
nRBC: 0 % (ref 0.0–0.2)

## 2024-07-09 LAB — I-STAT VENOUS BLOOD GAS, ED
Acid-base deficit: 1 mmol/L (ref 0.0–2.0)
Bicarbonate: 25.5 mmol/L (ref 20.0–28.0)
Calcium, Ion: 1.08 mmol/L — ABNORMAL LOW (ref 1.15–1.40)
HCT: 48 % (ref 39.0–52.0)
Hemoglobin: 16.3 g/dL (ref 13.0–17.0)
O2 Saturation: 72 %
Potassium: 4.1 mmol/L (ref 3.5–5.1)
Sodium: 137 mmol/L (ref 135–145)
TCO2: 27 mmol/L (ref 22–32)
pCO2, Ven: 47.3 mmHg (ref 44–60)
pH, Ven: 7.339 (ref 7.25–7.43)
pO2, Ven: 41 mmHg (ref 32–45)

## 2024-07-09 LAB — I-STAT CG4 LACTIC ACID, ED
Lactic Acid, Venous: 2.2 mmol/L (ref 0.5–1.9)
Lactic Acid, Venous: 1.8 mmol/L (ref 0.5–1.9)

## 2024-07-09 LAB — COMPREHENSIVE METABOLIC PANEL WITH GFR
ALT: 46 U/L — ABNORMAL HIGH (ref 0–44)
AST: 144 U/L — ABNORMAL HIGH (ref 15–41)
Albumin: 3.9 g/dL (ref 3.5–5.0)
Alkaline Phosphatase: 47 U/L (ref 38–126)
Anion gap: 12 (ref 5–15)
BUN: 13 mg/dL (ref 8–23)
CO2: 24 mmol/L (ref 22–32)
Calcium: 9 mg/dL (ref 8.9–10.3)
Chloride: 99 mmol/L (ref 98–111)
Creatinine, Ser: 1.02 mg/dL (ref 0.61–1.24)
GFR, Estimated: >60 mL/min
Glucose, Bld: 92 mg/dL (ref 70–99)
Potassium: 4.2 mmol/L (ref 3.5–5.1)
Sodium: 135 mmol/L (ref 135–145)
Total Bilirubin: 1.5 mg/dL — ABNORMAL HIGH (ref 0.0–1.2)
Total Protein: 8.4 g/dL — ABNORMAL HIGH (ref 6.5–8.1)

## 2024-07-09 LAB — LIPASE, BLOOD: Lipase: 37 U/L (ref 11–51)

## 2024-07-09 LAB — RESP PANEL BY RT-PCR (RSV, FLU A&B, COVID)  RVPGX2
Influenza A by PCR: POSITIVE — AB
Influenza B by PCR: NEGATIVE
Resp Syncytial Virus by PCR: NEGATIVE
SARS Coronavirus 2 by RT PCR: NEGATIVE

## 2024-07-09 LAB — ETHANOL: Alcohol, Ethyl (B): <15 mg/dL

## 2024-07-09 LAB — PRO BRAIN NATRIURETIC PEPTIDE: Pro Brain Natriuretic Peptide: 1912 pg/mL — ABNORMAL HIGH

## 2024-07-09 MED ORDER — ADULT MULTIVITAMIN W/MINERALS CH
1.0000 | ORAL_TABLET | Freq: Every day | ORAL | Status: DC
  Administered 2024-07-10 – 2024-07-16 (×7): 1 via ORAL
  Filled 2024-07-09 (×7): qty 1

## 2024-07-09 MED ORDER — SODIUM CHLORIDE 0.9 % IV BOLUS
500.0000 mL | Freq: Once | INTRAVENOUS | Status: AC
  Administered 2024-07-09: 500 mL via INTRAVENOUS

## 2024-07-09 MED ORDER — LORAZEPAM 2 MG/ML IJ SOLN
1.0000 mg | INTRAMUSCULAR | Status: AC | PRN
  Administered 2024-07-10: 2 mg via INTRAVENOUS
  Filled 2024-07-09 (×2): qty 1

## 2024-07-09 MED ORDER — ALBUTEROL SULFATE (2.5 MG/3ML) 0.083% IN NEBU
5.0000 mg | INHALATION_SOLUTION | Freq: Once | RESPIRATORY_TRACT | Status: AC
  Administered 2024-07-09: 5 mg via RESPIRATORY_TRACT
  Filled 2024-07-09: qty 6

## 2024-07-09 MED ORDER — THIAMINE MONONITRATE 100 MG PO TABS
100.0000 mg | ORAL_TABLET | Freq: Every day | ORAL | Status: DC
  Administered 2024-07-10 – 2024-07-16 (×7): 100 mg via ORAL
  Filled 2024-07-09 (×7): qty 1

## 2024-07-09 MED ORDER — LORAZEPAM 1 MG PO TABS
1.0000 mg | ORAL_TABLET | ORAL | Status: AC | PRN
  Administered 2024-07-10: 1 mg via ORAL
  Filled 2024-07-09: qty 1

## 2024-07-09 MED ORDER — IOHEXOL 350 MG/ML SOLN
75.0000 mL | Freq: Once | INTRAVENOUS | Status: AC | PRN
  Administered 2024-07-09: 75 mL via INTRAVENOUS

## 2024-07-09 MED ORDER — OSELTAMIVIR PHOSPHATE 75 MG PO CAPS
75.0000 mg | ORAL_CAPSULE | Freq: Two times a day (BID) | ORAL | Status: AC
  Administered 2024-07-10 – 2024-07-14 (×9): 75 mg via ORAL
  Filled 2024-07-09 (×9): qty 1

## 2024-07-09 MED ORDER — THIAMINE HCL 100 MG/ML IJ SOLN
100.0000 mg | Freq: Every day | INTRAMUSCULAR | Status: DC
  Administered 2024-07-10: 100 mg via INTRAVENOUS
  Filled 2024-07-09 (×2): qty 2

## 2024-07-09 MED ORDER — METHYLPREDNISOLONE SODIUM SUCC 125 MG IJ SOLR
125.0000 mg | INTRAMUSCULAR | Status: AC
  Administered 2024-07-09: 125 mg via INTRAVENOUS
  Filled 2024-07-09: qty 2

## 2024-07-09 MED ORDER — IPRATROPIUM-ALBUTEROL 0.5-2.5 (3) MG/3ML IN SOLN
3.0000 mL | Freq: Four times a day (QID) | RESPIRATORY_TRACT | Status: DC | PRN
  Administered 2024-07-10 (×2): 3 mL via RESPIRATORY_TRACT
  Filled 2024-07-09 (×2): qty 3

## 2024-07-09 MED ORDER — FOLIC ACID 1 MG PO TABS
1.0000 mg | ORAL_TABLET | Freq: Every day | ORAL | Status: DC
  Administered 2024-07-10 – 2024-07-16 (×7): 1 mg via ORAL
  Filled 2024-07-09 (×7): qty 1

## 2024-07-09 MED ORDER — QUETIAPINE FUMARATE 100 MG PO TABS
100.0000 mg | ORAL_TABLET | Freq: Every day | ORAL | Status: DC
  Administered 2024-07-10 – 2024-07-15 (×6): 100 mg via ORAL
  Filled 2024-07-09 (×6): qty 1

## 2024-07-09 MED ORDER — IPRATROPIUM BROMIDE 0.02 % IN SOLN
0.5000 mg | Freq: Once | RESPIRATORY_TRACT | Status: AC
  Administered 2024-07-09: 0.5 mg via RESPIRATORY_TRACT
  Filled 2024-07-09: qty 2.5

## 2024-07-09 MED ORDER — IBUPROFEN 400 MG PO TABS: 200.0000 mg | ORAL_TABLET | Freq: Four times a day (QID) | ORAL | Status: DC | PRN

## 2024-07-09 MED ORDER — APIXABAN 5 MG PO TABS
5.0000 mg | ORAL_TABLET | Freq: Two times a day (BID) | ORAL | Status: DC
  Administered 2024-07-10 – 2024-07-16 (×13): 5 mg via ORAL
  Filled 2024-07-09 (×8): qty 1
  Filled 2024-07-09: qty 2
  Filled 2024-07-09 (×5): qty 1

## 2024-07-09 NOTE — ED Provider Notes (Signed)
 " John Mcgu[facility] EMERGENCY DEPARTMENT AT [facility] Provider Note   CSN: [id number] Arrival date & time: 07/09/24  1658     Patient presents with: Shortness of Breath   John Mansel. is a 63 y.o. male.   63 year old male history of PE on Eliquis , alcohol and cocaine abuse disorder, and chronic right lower extremity wound who presents emergency department respiratory distress.  Patient reports that over the past 2 to 3 days has had a dry cough.  No fevers chills.  No sore throat.  No chest pain.  Says that his symptoms worsened 2 to 3 hours ago and decided to come into the emergency department for evaluation.  Says that he has been compliant with his Eliquis .  Does have a remote history of smoking but no formal diagnosis of COPD       Prior to Admission medications  Medication Sig Start Date End Date Taking? Authorizing Provider  apixaban  (ELIQUIS ) 5 MG TABS tablet Take 1 tablet (5 mg total) by mouth 2 (two) times daily. 09/14/23   John Prost, MD  QUEtiapine  (SEROQUEL ) 100 MG tablet TAKE 1 TABLET (100 MG TOTAL) BY MOUTH AT BEDTIME. FOLLOW UP WITH YOUR DOCTOR BEFORE THE NEXT REFILL. 11/23/23 12/23/23  John Prost, MD    Allergies: Betadine [povidone iodine]    Review of Systems  Updated Vital Signs BP 128/83   Pulse (!) 103   Temp 98.3 F (36.8 C) (Oral)   Resp (!) 38   Ht 5' 11 (1.803 m)   Wt 113.4 kg   SpO2 100%   BMI 34.87 kg/m   Physical Exam Constitutional:      General: He is in acute distress.     Appearance: He is ill-appearing.  Eyes:     Extraocular Movements: Extraocular movements intact.     Conjunctiva/sclera: Conjunctivae normal.     Pupils: Pupils are equal, round, and reactive to light.  Cardiovascular:     Rate and Rhythm: Regular rhythm. Tachycardia present.     Pulses: Normal pulses.     Heart sounds: Normal heart sounds.  Pulmonary:     Comments: Tachypneic.  Using accessory muscles.  Diminished breath sounds bilaterally.  No  obvious wheezing or rhonchi Musculoskeletal:     Right lower leg: Edema present.     Left lower leg: Edema present.     Comments: Right lower extremity wound is wrapped.  Patient is refusing to let us  take down the wound dressing to examine it.  States that there have been no changes and that is healing well.     (all labs ordered are listed, but only abnormal results are displayed) Labs Reviewed  RESP PANEL BY RT-PCR (RSV, FLU A&B, COVID)  RVPGX2 - Abnormal; Notable for the following components:      Result Value   Influenza A by PCR POSITIVE (*)    All other components within normal limits  COMPREHENSIVE METABOLIC PANEL WITH GFR - Abnormal; Notable for the following components:   Total Protein 8.4 (*)    AST 144 (*)    ALT 46 (*)    Total Bilirubin 1.5 (*)    All other components within normal limits  CBC WITH DIFFERENTIAL/PLATELET - Abnormal; Notable for the following components:   Lymphs Abs 0.5 (*)    All other components within normal limits  PRO BRAIN NATRIURETIC PEPTIDE - Abnormal; Notable for the following components:   Pro Brain Natriuretic Peptide 1,912.0 (*)    All other components  within normal limits  I-STAT CHEM 8, ED - Abnormal; Notable for the following components:   Calcium, Ion 1.08 (*)    All other components within normal limits  I-STAT VENOUS BLOOD GAS, ED - Abnormal; Notable for the following components:   Calcium, Ion 1.08 (*)    All other components within normal limits  I-STAT CG4 LACTIC ACID, ED - Abnormal; Notable for the following components:   Lactic Acid, Venous 2.2 (*)    All other components within normal limits  CULTURE, BLOOD (ROUTINE X 2)  CULTURE, BLOOD (ROUTINE X 2)  ETHANOL  URINE DRUGS OF ABUSE SCREEN W ALC, ROUTINE (REF LAB)  LIPASE, BLOOD  CBG MONITORING, ED  I-STAT CG4 LACTIC ACID, ED    EKG: EKG Interpretation Date/Time:  Saturday July 09 2024 17:26:23 EST Ventricular Rate:  110 PR Interval:  150 QRS Duration:  125 QT  Interval:  349 QTC Calculation: 473 R Axis:   96  Text Interpretation: Sinus tachycardia Probable left atrial enlargement RBBB and LPFB Confirmed by John Mcguire [phone]) on 07/09/2024 6:08:11 PM  Radiology: CT Angio Chest PE W and/or Wo Contrast Result Date: 07/09/2024 EXAM: CTA of the Chest with contrast for PE 07/09/2024 06:43:49 PM TECHNIQUE: CTA of the chest was performed after the administration of 75 mL of iohexol  (OMNIPAQUE ) 350 MG/ML injection. Multiplanar reformatted images are provided for review. MIP images are provided for review. Automated exposure control, iterative reconstruction, and/or weight based adjustment of the mA/kV was utilized to reduce the radiation dose to as low as reasonably achievable. COMPARISON: Chest x-ray from earlier in the same day. CLINICAL HISTORY: Shortness of breath. FINDINGS: PULMONARY ARTERIES: The pulmonary artery shows a normal branching pattern bilaterally. No intraluminal filling defect is identified to suggest pulmonary embolism. Main pulmonary artery is normal in caliber. MEDIASTINUM: The heart is not significantly enlarged in size. The thoracic aorta shows no aneurysmal dilatation. The degree of opacification is limited for dissection evaluation. The esophagus is within normal limits. The thoracic inlet is unremarkable. LYMPH NODES: No hilar or mediastinal adenopathy is seen. LUNGS AND PLEURA: Lungs are well aerated bilaterally. A predominantly ground glass nodule is noted in the right upper lobe, best seen on image 42 of series 12, measuring approximately 6 mm. A few scattered smaller nodules are identified in the right lung. No focal consolidation or pulmonary edema. Pleural effusion is noted. No pneumothorax. UPPER ABDOMEN: Limited images of the upper abdomen are unremarkable. SOFT TISSUES AND BONES: No acute bony abnormality. No acute soft tissue abnormality. IMPRESSION: 1. No pulmonary embolism. 2. 6 mm predominantly ground-glass right upper lobe  nodule with a few additional smaller right lung nodules; recommend non-contrast chest CT at 6-12 months to confirm persistence, then CT every 2 years until 5 years if persistent, as per Fleischner Society Guidelines. Electronically signed by: John Devonshire MD 07/09/2024 07:18 PM EST RP Workstation: [id number]   DG Chest Port 1 View Result Date: 07/09/2024 EXAM: 1 VIEW(S) XRAY OF THE CHEST 07/09/2024 05:58:00 PM COMPARISON: 02/16/2022 CLINICAL HISTORY: resp distress FINDINGS: LINES, TUBES AND DEVICES: Multiple wires and leads project over the chest on the frontal radiograph. LUNGS AND PLEURA: Lower lung predominant interstitial thickening is similar to the prior exam, given differences in technique. No pleural effusion. No pneumothorax. HEART AND MEDIASTINUM: No acute abnormality of the cardiac and mediastinal silhouettes. BONES AND SOFT TISSUES: Right axillary surgical clips noted. Subtle lower right rib deformities are favored to be related to remote fractures. IMPRESSION: 1. Lower lung predominant interstitial  thickening, likely related to smoking. No superimposed acute process. 2. Subtle lower right rib deformities, favored to be related to remote fractures. Correlate with point tenderness. CT pending. Electronically signed by: John Kilts MD 07/09/2024 06:28 PM EST RP Workstation: [id number]     Procedures   Medications Ordered in the ED  sodium chloride  0.9 % bolus 500 mL (has no administration in time range)  albuterol  (PROVENTIL ) (2.5 MG/3ML) 0.083% nebulizer solution 5 mg (has no administration in time range)  methylPREDNISolone  sodium succinate (SOLU-MEDROL ) 125 mg/2 mL injection 125 mg (125 mg Intravenous Given 07/09/24 1755)  ipratropium (ATROVENT ) nebulizer solution 0.5 mg (0.5 mg Nebulization Given 07/09/24 1742)  albuterol  (PROVENTIL ) (2.5 MG/3ML) 0.083% nebulizer solution 5 mg (5 mg Nebulization Given 07/09/24 1742)  iohexol  (OMNIPAQUE ) 350 MG/ML injection 75 mL (75 mLs Intravenous  Contrast Given 07/09/24 1843)    Clinical Course as of 07/09/24 2049  Sat Jul 09, 2024  2048 Dr Alfornia from hospitalist consulted for admission [RP]    Clinical Course User Index [RP] John Lamar BROCKS, MD                                 Medical Decision Making Amount and/or Complexity of Data Reviewed Labs: ordered. Radiology: ordered.  Risk Prescription drug management. Decision regarding hospitalization.   John Kielty. is a 63 year old male history of PE on Eliquis , alcohol and cocaine abuse, and chronic right lower extremity wound who presents to the emergency department in respiratory distress  Initial Ddx:  Hypoxia, hypercapnia, COPD, pneumonia, URI, PE  MDM/Course:  Patient presents emergency department with shortness of breath.  Does appear to be in respiratory distress with increased work of breathing.  Initially was on a nonrebreather.  Was quickly placed on BiPAP.  Lung sounds are diminished bilaterally.  Does not carry a formal diagnosis of COPD but does have a history of tobacco use in the past so was given steroids and nebulizers to see if they would help his symptoms.  Upon re-evaluation still somewhat diminished and was given additional dose of albuterol .  He underwent a CTA that did not show evidence of PE or pneumonia.  Does show right rib deformities but is not having any tenderness to palpation in that area.  He is found to have influenza A which I suspect is causing his symptoms.  BNP mildly elevated but no pulmonary edema on his scans making heart failure less likely.  Venous blood gas without significant CO2 retention.  Will admit to progressive for hypoxic respiratory failure due to influenza  This patient presents to the ED for concern of complaints listed in HPI, this involves an extensive number of treatment options, and is a complaint that carries with it a high risk of complications and morbidity. Disposition including potential need for  admission considered.   Dispo: admit  I have reviewed the patients home medications and made adjustments as needed Additional history obtained from EMS Records reviewed Outpatient Clinic Notes The following labs were independently interpreted: Chemistry and show no acute abnormality I independently reviewed the following imaging with scope of interpretation limited to determining acute life threatening conditions related to emergency care: Chest x-ray and agree with the radiologist interpretation with the following exceptions: none I personally reviewed and interpreted cardiac monitoring: sinus tachycardia I personally reviewed and interpreted the pt's EKG: see above for interpretation  Consults: Hospitalist   CRITICAL CARE Performed by: Lamar Mcguire John  Total critical care time: 45 minutes  Critical care time was exclusive of separately billable procedures and treating other patients.  Critical care was necessary to treat or prevent imminent or life-threatening deterioration.  Critical care was time spent personally by me on the following activities: development of treatment plan with patient and/or surrogate as well as nursing, discussions with consultants, evaluation of patient's response to treatment, examination of patient, obtaining history from patient or surrogate, ordering and performing treatments and interventions, ordering and review of laboratory studies, ordering and review of radiographic studies, pulse oximetry and re-evaluation of patient's condition.   Portions of this note were generated with Scientist, clinical (histocompatibility and immunogenetics). Dictation errors may occur despite best attempts at proofreading.     Final diagnoses:  Acute respiratory failure with hypoxia [facility])  Respiratory distress  Influenza    ED Discharge Orders     None          John Lamar BROCKS, MD 07/09/24 2016  "

## 2024-07-09 NOTE — ED Triage Notes (Signed)
 Pt bib [facility] from home for SOB x 2-3 hrs. Pitting edema noted. Denies CP  170 BP Tachy 86% RA 96% 15 NRB 120 HR 146/82
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
## 2024-07-09 NOTE — Progress Notes (Signed)
 Pt transported from ED35 to CT02 and back by RN and RT w/o complications

## 2024-07-09 NOTE — ED Notes (Signed)
 Nathanel Ferretti (Cousin) called for a update in [facility] in 35. phone number is [phone].

## 2024-07-09 NOTE — ED Notes (Signed)
 Notified RN in person of critically high istat lactic acid. KIT

## 2024-07-09 NOTE — ED Notes (Signed)
 Bipap

## 2024-07-10 ENCOUNTER — Observation Stay (HOSPITAL_COMMUNITY)

## 2024-07-10 DIAGNOSIS — R6520 Severe sepsis without septic shock: Secondary | ICD-10-CM | POA: Diagnosis present

## 2024-07-10 DIAGNOSIS — I878 Other specified disorders of veins: Secondary | ICD-10-CM | POA: Diagnosis present

## 2024-07-10 DIAGNOSIS — F1010 Alcohol abuse, uncomplicated: Secondary | ICD-10-CM | POA: Diagnosis present

## 2024-07-10 DIAGNOSIS — I10 Essential (primary) hypertension: Secondary | ICD-10-CM | POA: Diagnosis not present

## 2024-07-10 DIAGNOSIS — Z5320 Procedure and treatment not carried out because of patient's decision for unspecified reasons: Secondary | ICD-10-CM | POA: Diagnosis present

## 2024-07-10 DIAGNOSIS — J101 Influenza due to other identified influenza virus with other respiratory manifestations: Secondary | ICD-10-CM | POA: Diagnosis not present

## 2024-07-10 DIAGNOSIS — R7989 Other specified abnormal findings of blood chemistry: Secondary | ICD-10-CM | POA: Diagnosis not present

## 2024-07-10 DIAGNOSIS — Z7901 Long term (current) use of anticoagulants: Secondary | ICD-10-CM | POA: Diagnosis not present

## 2024-07-10 DIAGNOSIS — Z66 Do not resuscitate: Secondary | ICD-10-CM | POA: Diagnosis present

## 2024-07-10 DIAGNOSIS — F17200 Nicotine dependence, unspecified, uncomplicated: Secondary | ICD-10-CM | POA: Diagnosis present

## 2024-07-10 DIAGNOSIS — R000 Tachycardia, unspecified: Secondary | ICD-10-CM | POA: Diagnosis not present

## 2024-07-10 DIAGNOSIS — Z86711 Personal history of pulmonary embolism: Secondary | ICD-10-CM | POA: Diagnosis not present

## 2024-07-10 DIAGNOSIS — J9601 Acute respiratory failure with hypoxia: Secondary | ICD-10-CM | POA: Diagnosis present

## 2024-07-10 DIAGNOSIS — F5104 Psychophysiologic insomnia: Secondary | ICD-10-CM | POA: Diagnosis present

## 2024-07-10 DIAGNOSIS — Z720 Tobacco use: Secondary | ICD-10-CM | POA: Diagnosis not present

## 2024-07-10 DIAGNOSIS — I110 Hypertensive heart disease with heart failure: Secondary | ICD-10-CM | POA: Diagnosis present

## 2024-07-10 DIAGNOSIS — R918 Other nonspecific abnormal finding of lung field: Secondary | ICD-10-CM | POA: Diagnosis present

## 2024-07-10 DIAGNOSIS — R0603 Acute respiratory distress: Secondary | ICD-10-CM | POA: Diagnosis present

## 2024-07-10 DIAGNOSIS — Z86718 Personal history of other venous thrombosis and embolism: Secondary | ICD-10-CM | POA: Diagnosis not present

## 2024-07-10 DIAGNOSIS — A4189 Other specified sepsis: Secondary | ICD-10-CM | POA: Diagnosis present

## 2024-07-10 DIAGNOSIS — Z716 Tobacco abuse counseling: Secondary | ICD-10-CM | POA: Diagnosis not present

## 2024-07-10 DIAGNOSIS — E872 Acidosis, unspecified: Secondary | ICD-10-CM | POA: Diagnosis present

## 2024-07-10 DIAGNOSIS — I5033 Acute on chronic diastolic (congestive) heart failure: Secondary | ICD-10-CM | POA: Diagnosis present

## 2024-07-10 DIAGNOSIS — F1411 Cocaine abuse, in remission: Secondary | ICD-10-CM | POA: Diagnosis present

## 2024-07-10 DIAGNOSIS — J441 Chronic obstructive pulmonary disease with (acute) exacerbation: Secondary | ICD-10-CM | POA: Diagnosis present

## 2024-07-10 DIAGNOSIS — F1490 Cocaine use, unspecified, uncomplicated: Secondary | ICD-10-CM | POA: Diagnosis not present

## 2024-07-10 DIAGNOSIS — Z883 Allergy status to other anti-infective agents status: Secondary | ICD-10-CM | POA: Diagnosis not present

## 2024-07-10 DIAGNOSIS — R7401 Elevation of levels of liver transaminase levels: Secondary | ICD-10-CM | POA: Diagnosis present

## 2024-07-10 DIAGNOSIS — J1001 Influenza due to other identified influenza virus with the same other identified influenza virus pneumonia: Secondary | ICD-10-CM | POA: Diagnosis present

## 2024-07-10 DIAGNOSIS — F1090 Alcohol use, unspecified, uncomplicated: Secondary | ICD-10-CM | POA: Diagnosis not present

## 2024-07-10 DIAGNOSIS — J440 Chronic obstructive pulmonary disease with acute lower respiratory infection: Secondary | ICD-10-CM | POA: Diagnosis present

## 2024-07-10 DIAGNOSIS — Z888 Allergy status to other drugs, medicaments and biological substances status: Secondary | ICD-10-CM | POA: Diagnosis not present

## 2024-07-10 LAB — COMPREHENSIVE METABOLIC PANEL WITH GFR
ALT: 55 U/L — ABNORMAL HIGH (ref 0–44)
AST: 171 U/L — ABNORMAL HIGH (ref 15–41)
Albumin: 3.5 g/dL (ref 3.5–5.0)
Alkaline Phosphatase: 41 U/L (ref 38–126)
Anion gap: 8 (ref 5–15)
BUN: 14 mg/dL (ref 8–23)
CO2: 25 mmol/L (ref 22–32)
Calcium: 8.4 mg/dL — ABNORMAL LOW (ref 8.9–10.3)
Chloride: 100 mmol/L (ref 98–111)
Creatinine, Ser: 0.8 mg/dL (ref 0.61–1.24)
GFR, Estimated: >60 mL/min
Glucose, Bld: 133 mg/dL — ABNORMAL HIGH (ref 70–99)
Potassium: 4.7 mmol/L (ref 3.5–5.1)
Sodium: 134 mmol/L — ABNORMAL LOW (ref 135–145)
Total Bilirubin: 1 mg/dL (ref 0.0–1.2)
Total Protein: 7.5 g/dL (ref 6.5–8.1)

## 2024-07-10 LAB — BASIC METABOLIC PANEL WITH GFR
Anion gap: 11 (ref 5–15)
BUN: 18 mg/dL (ref 8–23)
CO2: 23 mmol/L (ref 22–32)
Calcium: 8.7 mg/dL — ABNORMAL LOW (ref 8.9–10.3)
Chloride: 98 mmol/L (ref 98–111)
Creatinine, Ser: 0.86 mg/dL (ref 0.61–1.24)
GFR, Estimated: >60 mL/min
Glucose, Bld: 119 mg/dL — ABNORMAL HIGH (ref 70–99)
Potassium: 5.1 mmol/L (ref 3.5–5.1)
Sodium: 132 mmol/L — ABNORMAL LOW (ref 135–145)

## 2024-07-10 LAB — HIV ANTIBODY (ROUTINE TESTING W REFLEX): HIV Screen 4th Generation wRfx: NONREACTIVE

## 2024-07-10 LAB — MAGNESIUM: Magnesium: 2.5 mg/dL — ABNORMAL HIGH (ref 1.7–2.4)

## 2024-07-10 LAB — PHOSPHORUS: Phosphorus: 2.8 mg/dL (ref 2.5–4.6)

## 2024-07-10 MED ORDER — METOPROLOL TARTRATE 5 MG/5ML IV SOLN
5.0000 mg | INTRAVENOUS | Status: DC | PRN
  Administered 2024-07-10 – 2024-07-11 (×2): 5 mg via INTRAVENOUS
  Filled 2024-07-10 (×2): qty 5

## 2024-07-10 MED ORDER — PREDNISONE 20 MG PO TABS
40.0000 mg | ORAL_TABLET | Freq: Every day | ORAL | Status: DC
  Administered 2024-07-10: 40 mg via ORAL
  Filled 2024-07-10 (×2): qty 2

## 2024-07-10 NOTE — ED Notes (Signed)
 Patient resting in bed. Denied pain confirmed he was comfortable.

## 2024-07-10 NOTE — ED Notes (Signed)
 Oral intake requested - lemon / lime. Patient remains on [place] but presents with dyspnea during conversation. Patient stated he was normal and felt fine. During conversation SpO2 decreased to 93% via 4 lpm . Will administered nebulizer.

## 2024-07-10 NOTE — ED Notes (Signed)
 Okay to place diet order per MD Al-Sultani.. food tray ordered
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
## 2024-07-10 NOTE — Progress Notes (Signed)
 Patient to [phone] from ED. MD notified of vitals. O2 88% on RA. 2L applied. On monitor CCMD notified. Call light within reach.   07/10/24 1612  Vitals  Temp 98 F (36.7 C)  Temp Source Oral  BP (!) 135/98  MAP (mmHg) 110  BP Location Left Arm  BP Method Automatic  Patient Position (if appropriate) Lying  Pulse Rate (!) 128  Pulse Rate Source Monitor  ECG Heart Rate (!) 128  Resp (!) 39  Level of Consciousness  Level of Consciousness Alert  Oxygen Therapy  SpO2 91 %  O2 Device Room Air

## 2024-07-10 NOTE — ED Notes (Signed)
 Patient talking with family on phone without noted discomfort or dyspnea. Patient reports feeling that nebulizer seemed to help with prior noted increased work of breathing. Patient remains on 4 lpm via [place] in upright position in bed without complaint.

## 2024-07-10 NOTE — ED Notes (Signed)
 Discussed with MD about oral meds and Bipap - aspiration risk . No oral meds given at this time, per MD.

## 2024-07-10 NOTE — ED Notes (Signed)
 Patient resting comfortably in bed. Medications taken without complications. Water  left at bedside.

## 2024-07-10 NOTE — Plan of Care (Signed)

## 2024-07-11 ENCOUNTER — Inpatient Hospital Stay (HOSPITAL_COMMUNITY)

## 2024-07-11 DIAGNOSIS — I10 Essential (primary) hypertension: Secondary | ICD-10-CM

## 2024-07-11 DIAGNOSIS — F1010 Alcohol abuse, uncomplicated: Secondary | ICD-10-CM

## 2024-07-11 DIAGNOSIS — R918 Other nonspecific abnormal finding of lung field: Secondary | ICD-10-CM

## 2024-07-11 DIAGNOSIS — R7989 Other specified abnormal findings of blood chemistry: Secondary | ICD-10-CM

## 2024-07-11 DIAGNOSIS — J101 Influenza due to other identified influenza virus with other respiratory manifestations: Secondary | ICD-10-CM | POA: Diagnosis not present

## 2024-07-11 DIAGNOSIS — R000 Tachycardia, unspecified: Secondary | ICD-10-CM

## 2024-07-11 DIAGNOSIS — J9601 Acute respiratory failure with hypoxia: Secondary | ICD-10-CM

## 2024-07-11 LAB — T4, FREE: Free T4: 0.62 ng/dL — ABNORMAL LOW (ref 0.80–2.00)

## 2024-07-11 LAB — ECHOCARDIOGRAM COMPLETE
AV Mean grad: 1 mmHg
AV Peak grad: 1.7 mmHg
Ao pk vel: 0.66 m/s
Area-P 1/2: 3.39 cm2
Height: 71 in
S' Lateral: 3 cm
Single Plane A4C EF: 57.8 %
Weight: 4000 [oz_av]

## 2024-07-11 LAB — URINE DRUG SCREEN
Amphetamines: NEGATIVE
Barbiturates: NEGATIVE
Benzodiazepines: POSITIVE — AB
Cocaine: NEGATIVE
Fentanyl: NEGATIVE
Methadone Scn, Ur: NEGATIVE
Opiates: NEGATIVE
Tetrahydrocannabinol: NEGATIVE

## 2024-07-11 LAB — COMPREHENSIVE METABOLIC PANEL WITH GFR
ALT: 54 U/L — ABNORMAL HIGH (ref 0–44)
AST: 171 U/L — ABNORMAL HIGH (ref 15–41)
Albumin: 3.4 g/dL — ABNORMAL LOW (ref 3.5–5.0)
Alkaline Phosphatase: 41 U/L (ref 38–126)
Anion gap: 9 (ref 5–15)
BUN: 18 mg/dL (ref 8–23)
CO2: 27 mmol/L (ref 22–32)
Calcium: 8.5 mg/dL — ABNORMAL LOW (ref 8.9–10.3)
Chloride: 100 mmol/L (ref 98–111)
Creatinine, Ser: 0.71 mg/dL (ref 0.61–1.24)
GFR, Estimated: >60 mL/min
Glucose, Bld: 97 mg/dL (ref 70–99)
Potassium: 4.5 mmol/L (ref 3.5–5.1)
Sodium: 136 mmol/L (ref 135–145)
Total Bilirubin: 1.1 mg/dL (ref 0.0–1.2)
Total Protein: 7.4 g/dL (ref 6.5–8.1)

## 2024-07-11 LAB — CBC
HCT: 45.9 % (ref 39.0–52.0)
Hemoglobin: 15.4 g/dL (ref 13.0–17.0)
MCH: 32.6 pg (ref 26.0–34.0)
MCHC: 33.6 g/dL (ref 30.0–36.0)
MCV: 97.2 fL (ref 80.0–100.0)
Platelets: 178 K/uL (ref 150–400)
RBC: 4.72 MIL/uL (ref 4.22–5.81)
RDW: 13.7 % (ref 11.5–15.5)
WBC: 4.3 K/uL (ref 4.0–10.5)
nRBC: 0 % (ref 0.0–0.2)

## 2024-07-11 LAB — PRO BRAIN NATRIURETIC PEPTIDE: Pro Brain Natriuretic Peptide: 325 pg/mL — ABNORMAL HIGH

## 2024-07-11 LAB — TSH: TSH: 1.98 u[IU]/mL (ref 0.350–4.500)

## 2024-07-11 LAB — C-REACTIVE PROTEIN: CRP: 4.5 mg/dL — ABNORMAL HIGH

## 2024-07-11 LAB — PROCALCITONIN: Procalcitonin: 0.18 ng/mL

## 2024-07-11 MED ORDER — TRAMADOL HCL 50 MG PO TABS
50.0000 mg | ORAL_TABLET | Freq: Four times a day (QID) | ORAL | Status: DC | PRN
  Filled 2024-07-11: qty 1

## 2024-07-11 MED ORDER — METHYLPREDNISOLONE SODIUM SUCC 125 MG IJ SOLR
60.0000 mg | INTRAMUSCULAR | Status: DC
  Administered 2024-07-11 – 2024-07-16 (×6): 60 mg via INTRAVENOUS
  Filled 2024-07-11 (×6): qty 2

## 2024-07-11 MED ORDER — DILTIAZEM HCL 60 MG PO TABS
120.0000 mg | ORAL_TABLET | Freq: Three times a day (TID) | ORAL | Status: DC
  Administered 2024-07-11 – 2024-07-12 (×2): 120 mg via ORAL
  Filled 2024-07-11 (×2): qty 2

## 2024-07-11 MED ORDER — SODIUM CHLORIDE 0.9 % IV SOLN
3.0000 g | Freq: Four times a day (QID) | INTRAVENOUS | Status: AC
  Administered 2024-07-11 – 2024-07-15 (×18): 3 g via INTRAVENOUS
  Filled 2024-07-11 (×19): qty 8

## 2024-07-11 MED ORDER — DILTIAZEM HCL 60 MG PO TABS
90.0000 mg | ORAL_TABLET | Freq: Three times a day (TID) | ORAL | Status: DC
  Administered 2024-07-11: 90 mg via ORAL
  Filled 2024-07-11: qty 1

## 2024-07-11 MED ORDER — FUROSEMIDE 10 MG/ML IJ SOLN
60.0000 mg | Freq: Once | INTRAMUSCULAR | Status: AC
  Administered 2024-07-11: 60 mg via INTRAVENOUS
  Filled 2024-07-11: qty 6

## 2024-07-11 MED ORDER — IPRATROPIUM-ALBUTEROL 0.5-2.5 (3) MG/3ML IN SOLN
3.0000 mL | Freq: Once | RESPIRATORY_TRACT | Status: AC
  Administered 2024-07-11: 3 mL via RESPIRATORY_TRACT
  Filled 2024-07-11: qty 3

## 2024-07-11 MED ORDER — DILTIAZEM HCL 25 MG/5ML IV SOLN
10.0000 mg | Freq: Four times a day (QID) | INTRAVENOUS | Status: DC | PRN
  Administered 2024-07-11: 10 mg via INTRAVENOUS
  Filled 2024-07-11: qty 5

## 2024-07-11 MED ORDER — FUROSEMIDE 10 MG/ML IJ SOLN
20.0000 mg | Freq: Once | INTRAMUSCULAR | Status: AC
  Administered 2024-07-11: 20 mg via INTRAVENOUS
  Filled 2024-07-11: qty 2

## 2024-07-11 MED ORDER — LACTATED RINGERS IV SOLN: INTRAVENOUS | Status: DC

## 2024-07-11 MED ORDER — CHLORDIAZEPOXIDE HCL 5 MG PO CAPS
10.0000 mg | ORAL_CAPSULE | Freq: Three times a day (TID) | ORAL | Status: DC
  Administered 2024-07-11 – 2024-07-16 (×17): 10 mg via ORAL
  Filled 2024-07-11 (×17): qty 2

## 2024-07-11 MED ORDER — ACETAMINOPHEN 500 MG PO TABS
500.0000 mg | ORAL_TABLET | Freq: Four times a day (QID) | ORAL | Status: DC | PRN
  Administered 2024-07-15: 500 mg via ORAL
  Filled 2024-07-11: qty 1

## 2024-07-11 NOTE — Progress Notes (Signed)
 SLP Cancellation Note  Patient Details Name: John Mcguire. MRN: [id number] DOB: 21-Dec-1960   Cancelled treatment:       Reason Eval/Treat Not Completed: Medical issues which prohibited therapy (on BiPAP). SLP will f/u as able.    Damien Blumenthal, M.A., CCC-SLP Speech Language Pathology, Acute Rehabilitation Services  Secure Chat preferred [phone]  07/11/2024, 2:50 PM

## 2024-07-11 NOTE — Progress Notes (Signed)
 PT Cancellation Note  Patient Details Name: Huel Centola. MRN: [id number] DOB: 19-Feb-1961   Cancelled Treatment:    Reason Eval/Treat Not Completed: Medical issues which prohibited therapy. PT attempted x2 in AM however HR (120-130) and RR elevated (26-42). RN provided medication for tachycardia however HR remained elevated upon PT return. RN asked PT to hold therapy for now; RN to discuss vitals with MD. PT will re-attempt as able when pt able to participate.   Isaiah DEL. Nehemias Sauceda, PT, DPT  [employer] 07/11/2024, 11:33 AM
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
## 2024-07-11 NOTE — Plan of Care (Signed)
   Problem: Education: Goal: Knowledge of General Education information will improve Description: Including pain rating scale, medication(s)/side effects and non-pharmacologic comfort measures Outcome: Progressing   Problem: Activity: Goal: Risk for activity intolerance will decrease Outcome: Progressing   Problem: Nutrition: Goal: Adequate nutrition will be maintained Outcome: Progressing

## 2024-07-11 NOTE — Progress Notes (Signed)
 OT Cancellation Note  Patient Details Name: John Mcguire. MRN: [id number] DOB: 1960/11/18   Cancelled Treatment:    Reason Eval/Treat Not Completed: Medical issues which prohibited therapy.  Elevated vitals, advised to hold for today.  Continue efforts.    Katharine Rochefort D Vermell Madrid 07/11/2024, 1:53 PM 07/11/2024  RP, OTR/L  [facility]  Office:  [phone]

## 2024-07-12 DIAGNOSIS — F1490 Cocaine use, unspecified, uncomplicated: Secondary | ICD-10-CM

## 2024-07-12 DIAGNOSIS — F1090 Alcohol use, unspecified, uncomplicated: Secondary | ICD-10-CM

## 2024-07-12 DIAGNOSIS — Z720 Tobacco use: Secondary | ICD-10-CM

## 2024-07-12 DIAGNOSIS — J101 Influenza due to other identified influenza virus with other respiratory manifestations: Secondary | ICD-10-CM | POA: Diagnosis not present

## 2024-07-12 LAB — COMPREHENSIVE METABOLIC PANEL WITH GFR
ALT: 46 U/L — ABNORMAL HIGH (ref 0–44)
AST: 123 U/L — ABNORMAL HIGH (ref 15–41)
Albumin: 3.3 g/dL — ABNORMAL LOW (ref 3.5–5.0)
Alkaline Phosphatase: 44 U/L (ref 38–126)
Anion gap: 12 (ref 5–15)
BUN: 23 mg/dL (ref 8–23)
CO2: 24 mmol/L (ref 22–32)
Calcium: 8.8 mg/dL — ABNORMAL LOW (ref 8.9–10.3)
Chloride: 98 mmol/L (ref 98–111)
Creatinine, Ser: 0.82 mg/dL (ref 0.61–1.24)
GFR, Estimated: >60 mL/min
Glucose, Bld: 137 mg/dL — ABNORMAL HIGH (ref 70–99)
Potassium: 4.2 mmol/L (ref 3.5–5.1)
Sodium: 135 mmol/L (ref 135–145)
Total Bilirubin: 1.1 mg/dL (ref 0.0–1.2)
Total Protein: 7.4 g/dL (ref 6.5–8.1)

## 2024-07-12 LAB — CBC WITH DIFFERENTIAL/PLATELET
Abs Immature Granulocytes: 0.03 K/uL (ref 0.00–0.07)
Basophils Absolute: 0 K/uL (ref 0.0–0.1)
Basophils Relative: 0 %
Eosinophils Absolute: 0 K/uL (ref 0.0–0.5)
Eosinophils Relative: 0 %
HCT: 47.2 % (ref 39.0–52.0)
Hemoglobin: 15.7 g/dL (ref 13.0–17.0)
Immature Granulocytes: 1 %
Lymphocytes Relative: 21 %
Lymphs Abs: 0.8 K/uL (ref 0.7–4.0)
MCH: 32.7 pg (ref 26.0–34.0)
MCHC: 33.3 g/dL (ref 30.0–36.0)
MCV: 98.3 fL (ref 80.0–100.0)
Monocytes Absolute: 0.8 K/uL (ref 0.1–1.0)
Monocytes Relative: 22 %
Neutro Abs: 2.1 K/uL (ref 1.7–7.7)
Neutrophils Relative %: 56 %
Platelets: 176 K/uL (ref 150–400)
RBC: 4.8 MIL/uL (ref 4.22–5.81)
RDW: 13.4 % (ref 11.5–15.5)
WBC: 3.7 K/uL — ABNORMAL LOW (ref 4.0–10.5)
nRBC: 0 % (ref 0.0–0.2)

## 2024-07-12 LAB — URINE DRUGS OF ABUSE SCREEN W ALC, ROUTINE (REF LAB)
Amphetamines, Urine: NEGATIVE ng/mL
Barbiturate, Ur: NEGATIVE ng/mL
Benzodiazepine Quant, Ur: NEGATIVE ng/mL
Cannabinoid Quant, Ur: NEGATIVE ng/mL
Cocaine (Metab.): NEGATIVE ng/mL
Creatinine, Urine: 143.2 mg/dL (ref 20.0–300.0)
Ethanol U, Quan: NEGATIVE %
Methadone Screen, Urine: NEGATIVE ng/mL
Nitrite Urine, Quantitative: NEGATIVE ug/mL
OPIATE SCREEN URINE: NEGATIVE ng/mL
Phencyclidine, Ur: NEGATIVE ng/mL
Propoxyphene, Urine: NEGATIVE ng/mL
pH, Urine: 5.5 (ref 4.5–8.9)

## 2024-07-12 LAB — PHOSPHORUS: Phosphorus: 3.3 mg/dL (ref 2.5–4.6)

## 2024-07-12 LAB — PRO BRAIN NATRIURETIC PEPTIDE: Pro Brain Natriuretic Peptide: 96.7 pg/mL

## 2024-07-12 LAB — MAGNESIUM: Magnesium: 2.5 mg/dL — ABNORMAL HIGH (ref 1.7–2.4)

## 2024-07-12 LAB — C-REACTIVE PROTEIN: CRP: 3.1 mg/dL — ABNORMAL HIGH

## 2024-07-12 MED ORDER — DILTIAZEM HCL 60 MG PO TABS
60.0000 mg | ORAL_TABLET | Freq: Three times a day (TID) | ORAL | Status: DC
  Administered 2024-07-12 – 2024-07-16 (×13): 60 mg via ORAL
  Filled 2024-07-12 (×13): qty 1

## 2024-07-12 MED ORDER — FUROSEMIDE 10 MG/ML IJ SOLN
60.0000 mg | Freq: Once | INTRAMUSCULAR | Status: AC
  Administered 2024-07-12: 60 mg via INTRAVENOUS
  Filled 2024-07-12: qty 6

## 2024-07-12 NOTE — Progress Notes (Signed)
 PHARMACY - PHYSICIAN COMMUNICATION CRITICAL VALUE ALERT - BLOOD CULTURE IDENTIFICATION (BCID)  Alvan Culpepper. is an 63 y.o. male who presented to [facility] on 07/09/2024 with a chief complaint of SOB and pitting edema.  Assessment:  Admitted for influenza A, then started on Unasyn  for concern for aspiration PNA, now w/ GPR growing in 1 of 4 blood cx bottles, likely contaminant.  Name of physician contacted: Thomasine MD  Current antibiotics: Unasyn   Changes to prescribed antibiotics recommended:  No changes needed   Marvetta Dauphin, PharmD, BCPS  07/12/2024  3:38 AM

## 2024-07-12 NOTE — Progress Notes (Signed)
Bipap not needed at this time. °
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
## 2024-07-12 NOTE — Evaluation (Signed)
 Occupational Therapy Evaluation Patient Details Name: John Mcguire. MRN: [id number] DOB: 02-14-1961 Today's Date: 07/12/2024   History of Present Illness   63 y/o Male admitted 12/20 SOB placed on NRB 15L in ED. Pt with sepsis with AHRF due to flu A causing COPD exacerbation with viral PNA bronchitis  PMH hx DVT/ PE on Eliquis , BLE venous insufficiency, chronic RLE wound, osteomyelitis, insomnia, polysubstance abuse.     Clinical Impressions Pt admitted with concerns mentioned above. Pt currently with functional limitations due to the deficits listed below (see OT Problem List). Prior to admit, pt was living alone and independent-mod I for all ADL tasks. Pt will benefit from acute skilled OT to increase their safety and independence with ADL and functional mobility for ADL to facilitate discharge. Patient will benefit from continued inpatient follow up therapy, <3 hours/day at discharge.    BP during session: 133/87 (101) supine 129/102 (110) seated EOB 124/92 (102) seated in recliner     If plan is discharge home, recommend the following:   A little help with walking and/or transfers;A little help with bathing/dressing/bathroom;Assistance with cooking/housework;Assist for transportation     Functional Status Assessment   Patient has had a recent decline in their functional status and demonstrates the ability to make significant improvements in function in a reasonable and predictable amount of time.     Equipment Recommendations   Tub/shower bench      Precautions/Restrictions   Precautions Precautions: Fall Recall of Precautions/Restrictions: Intact Restrictions Weight Bearing Restrictions Per Provider Order: No     Mobility Bed Mobility Overal bed mobility: Needs Assistance Bed Mobility: Supine to Sit     Supine to sit: Min assist, HOB elevated, Used rails     General bed mobility comments: increased time to complete. Initially reported  dizziness when attempting to sit on EOB. 1 hand hold assist to bring trunk up off HOB. Bed pad used to scoot left hip towards EOB    Transfers Overall transfer level: Needs assistance Equipment used: Rolling walker (2 wheels) Transfers: Sit to/from Stand, Bed to chair/wheelchair/BSC Sit to Stand: Min assist, +2 safety/equipment, From elevated surface     Step pivot transfers: Contact guard assist, +2 safety/equipment     General transfer comment: VC for hand placement during RW mangement. Bed elevated to mimic height of at home.      Balance Overall balance assessment: Needs assistance Sitting-balance support: No upper extremity supported, Feet supported Sitting balance-Leahy Scale: Fair Sitting balance - Comments: sitting EOB   Standing balance support: Bilateral upper extremity supported, During functional activity, Reliant on assistive device for balance Standing balance-Leahy Scale: Poor Standing balance comment: reliant on RW              ADL either performed or assessed with clinical judgement   ADL Overall ADL's : Needs assistance/impaired     Grooming: Set up;Sitting   Upper Body Bathing: Set up;Sitting   Lower Body Bathing: Maximal assistance;Sitting/lateral leans   Upper Body Dressing : Set up;Sitting   Lower Body Dressing: Maximal assistance;Sitting/lateral leans   Toilet Transfer: Minimal assistance;+2 for safety/equipment;Cueing for safety;Stand-pivot;Rolling walker (2 wheels)           Functional mobility during ADLs: Contact guard assist;+2 for safety/equipment;Rolling walker (2 wheels)       Vision Baseline Vision/History: 0 No visual deficits Ability to See in Adequate Light: 0 Adequate Patient Visual Report: No change from baseline Vision Assessment?: No apparent visual deficits     Perception Perception:  Not tested       Praxis Praxis: Not tested       Pertinent Vitals/Pain Pain Assessment Pain Assessment: No/denies pain      Extremity/Trunk Assessment Upper Extremity Assessment Upper Extremity Assessment: Generalized weakness;Left hand dominant   Lower Extremity Assessment Lower Extremity Assessment: Defer to PT evaluation   Cervical / Trunk Assessment Cervical / Trunk Assessment:  (body habitus)   Communication Communication Communication: No apparent difficulties   Cognition Arousal: Alert Behavior During Therapy: Flat affect Cognition: No apparent impairments      Following commands: Intact       Cueing  General Comments   Cueing Techniques: Verbal cues  chronic R leg wound with leg wrapped from below knee to toes           Home Living Family/patient expects to be discharged to:: Private residence Living Arrangements: Alone   Type of Home: Apartment Home Access: Engineer, Structural (lives on 6th floor)     Home Layout: One level     Bathroom Shower/Tub: Chief Strategy Officer: Standard     Home Equipment: Cane - single point;Grab bars - tub/shower          Prior Functioning/Environment Prior Level of Function : Independent/Modified Independent             Mobility Comments: takes public transportation      OT Problem List: Decreased strength;Decreased activity tolerance;Impaired balance (sitting and/or standing)   OT Treatment/Interventions: Self-care/ADL training;Therapeutic exercise;Energy conservation;DME and/or AE instruction;Therapeutic activities;Patient/family education;Balance training      OT Goals(Current goals can be found in the care plan section)   Acute Rehab OT Goals Patient Stated Goal: to stay in bed OT Goal Formulation: With patient Time For Goal Achievement: 07/27/24 Potential to Achieve Goals: Good   OT Frequency:  Min 2X/week    Co-evaluation PT/OT/SLP Co-Evaluation/Treatment: Yes Reason for Co-Treatment: To address functional/ADL transfers   OT goals addressed during session: ADL's and self-care;Strengthening/ROM       AM-PAC OT 6 Clicks Daily Activity     Outcome Measure Help from another person eating meals?: None Help from another person taking care of personal grooming?: None Help from another person toileting, which includes using toliet, bedpan, or urinal?: A Lot Help from another person bathing (including washing, rinsing, drying)?: A Lot Help from another person to put on and taking off regular upper body clothing?: A Little Help from another person to put on and taking off regular lower body clothing?: A Lot 6 Click Score: 17   End of Session Equipment Utilized During Treatment: Gait belt;Rolling walker (2 wheels);Oxygen  Activity Tolerance: Patient limited by fatigue Patient left: in chair;with call bell/phone within reach;with chair alarm set  OT Visit Diagnosis: Muscle weakness (generalized) (M62.81)                Time: 8962-8889 OT Time Calculation (min): 33 min Charges:  OT General Charges $OT Visit: 1 Visit OT Evaluation $OT Eval Moderate Complexity: 1 Mod  [employer], OTR/L,CBIS  Supplemental OT - [facility] and [facility] Secure Chat Preferred    Oluwadamilola Deliz, Leita BIRCH 07/12/2024, 11:39 AM

## 2024-07-12 NOTE — Evaluation (Signed)
 Clinical/Bedside Swallow Evaluation Patient Details  Name: John Mcguire. MRN: [id number] Date of Birth: 11/12/1960  Today's Date: 07/12/2024 Time: SLP Start Time (ACUTE ONLY): 1607 SLP Stop Time (ACUTE ONLY): 1620 SLP Time Calculation (min) (ACUTE ONLY): 13 min  Past Medical History:  Past Medical History:  Diagnosis Date   pulmonary embolism    Past Surgical History: History reviewed. No pertinent surgical history. HPI:  63 yo male presenting to ED 12/20 with SOB, requiring intermittent BiPAP. Admitted with sepsis and acute hypoxemic respiratory failure secondary to flu A causing COPD exacerbation. CTA negative for pulmonary embolism or pulmonary edema but did show pulmonary nodules. CXR 12/22 negative for active disease. PMH includes DVT/PE on Eliquis , BLE venous insufficiency, insomnia, polysubstance abuse, chronic RLE wound, osteomyelitis    Assessment / Plan / Recommendation  Clinical Impression  Pt is using pacing strategies independently to compensate for WOB. Will advance to regular solids and continue thin liquids. Ongoing SLP f/u is not needed at this time, will sign off.   Pt presents with increased WOB but stable SpO2 >95% on 4L [place]. He fed himself regular solids, masticating thoroughly and taking breaks as needed. Consecutive straw sips of thin liquids were observed without signs clinically concerning for aspiration.   SLP Visit Diagnosis: Dysphagia, unspecified (R13.10)    Aspiration Risk  Mild aspiration risk    Diet Recommendation           Other Recommendations Oral Care Recommendations: Oral care BID     Swallow Evaluation Recommendations Recommendations: PO diet PO Diet Recommendation: Regular;Thin liquids (Level 0) Liquid Administration via: Cup;Straw Medication Administration: Whole meds with puree Supervision: Patient able to self-feed Swallowing strategies  : Slow rate;Small bites/sips Postural changes: Position pt fully upright for meals Oral  care recommendations: Oral care BID (2x/day)   Assistance Recommended at Discharge    Functional Status Assessment Patient has not had a recent decline in their functional status  Frequency and Duration            Prognosis Prognosis for improved oropharyngeal function: Good      Swallow Study   General HPI: 63 yo male presenting to ED 12/20 with SOB, requiring intermittent BiPAP. Admitted with sepsis and acute hypoxemic respiratory failure secondary to flu A causing COPD exacerbation. CTA negative for pulmonary embolism or pulmonary edema but did show pulmonary nodules. CXR 12/22 negative for active disease. PMH includes DVT/PE on Eliquis , BLE venous insufficiency, insomnia, polysubstance abuse, chronic RLE wound, osteomyelitis Type of Study: Bedside Swallow Evaluation Previous Swallow Assessment: none in chart Diet Prior to this Study: Dysphagia 1 (pureed);Thin liquids (Level 0) Temperature Spikes Noted: No Respiratory Status: Nasal cannula (4L) History of Recent Intubation: No Behavior/Cognition: Alert;Cooperative Oral Cavity Assessment: Within Functional Limits Oral Care Completed by SLP: No Oral Cavity - Dentition: Adequate natural dentition Vision: Functional for self-feeding Self-Feeding Abilities: Able to feed self Patient Positioning: Upright in bed Baseline Vocal Quality: Normal Volitional Cough: Strong Volitional Swallow: Able to elicit    Oral/Motor/Sensory Function Overall Oral Motor/Sensory Function: Within functional limits   Ice Chips Ice chips: Not tested   Thin Liquid Thin Liquid: Within functional limits Presentation: Straw;Self Fed    Nectar Thick Nectar Thick Liquid: Not tested   Honey Thick Honey Thick Liquid: Not tested   Puree Puree: Not tested   Solid     Solid: Within functional limits Presentation: Self Fed      John Mcguire, M.A., CCC-SLP Speech Language Pathology, Acute Rehabilitation Services  Secure  Chat  preferred [phone]  07/12/2024,4:33 PM
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
## 2024-07-12 NOTE — Plan of Care (Signed)
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
## 2024-07-12 NOTE — Evaluation (Signed)
 Physical Therapy Evaluation Patient Details Name: John Mcguire. MRN: [id number] DOB: 1961/04/16 Today's Date: 07/12/2024  History of Present Illness  63 yo M adm 12/20 SOB placed on NRB 15L in ED. Pt with sepsis with AHRF due to flu A causing COPD exacerbation with viral PNA bronchitis  PMH hx DVT/ PE on Eliquis , BLE venous insufficiency, chronic RLE wound, osteomyelitis, insomnia, polysubstance abuse.   Clinical Impression  Pt is currently mobilizing below his baseline due to SOB, balance, and weakness. Pt reports increased dizziness with bed mobility and benefits from resting breaks between position changes. BP monitored throughout and is not orthostatic. Pt requires minA for bed mobility, STS, and step pivot transfer to chair. Pt slightly unsteady with standing tasks and benefits from use of rolling walker at this time for standing mobility. Pt SOB throughout, limiting his tolerance to activity. Education provided on incentive spirometer use and pt demonstrated appropriate mechanics but was only tolerating 200 mL due to SOB. Pt coughing throughout session but does not produce sputum. Pt would benefit from continued PT services focused on strength, balance, and progressing gait to promote independence with functional mobility and build activity tolerance.         If plan is discharge home, recommend the following: A lot of help with walking and/or transfers;A lot of help with bathing/dressing/bathroom;Assistance with cooking/housework;Assist for transportation;Help with stairs or ramp for entrance   Can travel by private vehicle   No    Equipment Recommendations Rolling walker (2 wheels);BSC/3in1  Recommendations for Other Services       Functional Status Assessment Patient has had a recent decline in their functional status and demonstrates the ability to make significant improvements in function in a reasonable and predictable amount of time.     Precautions / Restrictions  Precautions Precautions: Fall Recall of Precautions/Restrictions: Intact Restrictions Weight Bearing Restrictions Per Provider Order: No      Mobility  Bed Mobility Overal bed mobility: Needs Assistance Bed Mobility: Supine to Sit     Supine to sit: Contact guard, Min assist     General bed mobility comments: Pt with difficulty tolerating bed mobility due to dizziness that improves partially with time. Breaks taken between supine and seated positions to manage dizziness. Light hand held assist required to bring trunk upright.    Transfers Overall transfer level: Needs assistance Equipment used: Rolling walker (2 wheels) Transfers: Sit to/from Stand, Bed to chair/wheelchair/BSC Sit to Stand: Min assist   Step pivot transfers: Min assist       General transfer comment: Pt's steps are unsteady with transfers and has difficulty due to SOB throughout. Small shuffled steps taken to complete step pivot transfer. Pt steady with walker in static stance.    Ambulation/Gait                  Stairs            Wheelchair Mobility     Tilt Bed    Modified Rankin (Stroke Patients Only)       Balance Overall balance assessment: Mild deficits observed, not formally tested Sitting-balance support: No upper extremity supported Sitting balance-Leahy Scale: Good Sitting balance - Comments: No sitting balance concerns.   Standing balance support: Bilateral upper extremity supported Standing balance-Leahy Scale: Fair Standing balance comment: Pt slightly unsteady with standing mobility, benefits from use of walker though uses cane at baseline. No LOB or knee buckling noted with transfers.  Pertinent Vitals/Pain Pain Assessment Pain Assessment: No/denies pain    Home Living Family/patient expects to be discharged to:: Private residence Living Arrangements: Alone   Type of Home: Apartment (6th floor apt) Home Access:  Elevator       Home Layout: One level Home Equipment: Cane - single point;Grab bars - toilet;Grab bars - tub/shower      Prior Function Prior Level of Function : Independent/Modified Independent             Mobility Comments: Pt reports using cane most of the time, can't say all the time ADLs Comments: Pt reports no concerns iwth ADLs prior to admission     Extremity/Trunk Assessment   Upper Extremity Assessment Upper Extremity Assessment: Defer to OT evaluation    Lower Extremity Assessment Lower Extremity Assessment: Overall WFL for tasks assessed;Generalized weakness    Cervical / Trunk Assessment Cervical / Trunk Assessment: Normal  Communication   Communication Communication: No apparent difficulties    Cognition Arousal: Alert Behavior During Therapy: WFL for tasks assessed/performed   PT - Cognitive impairments: No apparent impairments                         Following commands: Intact       Cueing Cueing Techniques: Verbal cues, Tactile cues, Visual cues     General Comments General comments (skin integrity, edema, etc.): RR and HR elevated with activity. Resting BP: 133/73, seated BP 129/102, resting BP at end of session 124/92. R LE wound wrapped C,D,I.    Exercises     Assessment/Plan    PT Assessment Patient needs continued PT services  PT Problem List Decreased strength;Decreased mobility;Cardiopulmonary status limiting activity;Decreased activity tolerance;Decreased balance;Decreased knowledge of use of DME;Decreased knowledge of precautions       PT Treatment Interventions DME instruction;Therapeutic exercise;Gait training;Balance training;Stair training;Neuromuscular re-education;Functional mobility training;Therapeutic activities;Patient/family education    PT Goals (Current goals can be found in the Care Plan section)  Acute Rehab PT Goals Patient Stated Goal: No additional PT goals stated during session    Frequency  Min 2X/week     Co-evaluation PT/OT/SLP Co-Evaluation/Treatment: Yes Reason for Co-Treatment: Complexity of the patient's impairments (multi-system involvement);To address functional/ADL transfers PT goals addressed during session: Mobility/safety with mobility;Balance;Proper use of DME OT goals addressed during session: ADL's and self-care;Proper use of Adaptive equipment and DME       AM-PAC PT 6 Clicks Mobility  Outcome Measure Help needed turning from your back to your side while in a flat bed without using bedrails?: None Help needed moving from lying on your back to sitting on the side of a flat bed without using bedrails?: A Little Help needed moving to and from a bed to a chair (including a wheelchair)?: A Little Help needed standing up from a chair using your arms (e.g., wheelchair or bedside chair)?: A Little Help needed to walk in hospital room?: A Lot Help needed climbing 3-5 steps with a railing? : Total 6 Click Score: 16    End of Session Equipment Utilized During Treatment: Gait belt Activity Tolerance: Patient limited by fatigue (Pt limited by SOB) Patient left: in chair;with call bell/phone within reach;with chair alarm set Nurse Communication: Mobility status PT Visit Diagnosis: Unsteadiness on feet (R26.81);Muscle weakness (generalized) (M62.81)    Time: 8962-8889 PT Time Calculation (min) (ACUTE ONLY): 33 min   Charges:   PT Evaluation $PT Eval Moderate Complexity: 1 Mod PT Treatments $Therapeutic Activity: 8-22 mins PT General Charges $$  ACUTE PT VISIT: 1 Visit         Sabra Morel, PT, DPT  Acute Rehabilitation Services         Office: [phone]     Sabra MARLA Morel 07/12/2024, 3:28 PM

## 2024-07-12 NOTE — TOC CAGE-AID Note (Signed)
 Transition of Care [facility]) - CAGE-AID Screening   Patient Details  Name: John Mcguire. MRN: [id number] Date of Birth: [birth year]  [facility]) CM/SW Contact:    Landry DELENA Senters, RN Phone Number: 07/12/2024, 10:03 AM   Clinical Narrative:  Patient denies need for drug or alcohol inpatient or outpatient counseling resources.   CAGE-AID Screening: Substance Abuse Screening unable to be completed due to: : Patient Refused  Have You Ever Felt You Ought to Cut Down on Your Drinking or Drug Use?: No Have People Annoyed You By Critizing Your Drinking Or Drug Use?: No Have You Felt Bad Or Guilty About Your Drinking Or Drug Use?: No Have You Ever Had a Drink or Used Drugs First Thing In The Morning to Steady Your Nerves or to Get Rid of a Hangover?: No CAGE-AID Score: 0  Substance Abuse Education Offered: Yes

## 2024-07-13 ENCOUNTER — Encounter (HOSPITAL_BASED_OUTPATIENT_CLINIC_OR_DEPARTMENT_OTHER): Admitting: General Surgery

## 2024-07-13 DIAGNOSIS — J101 Influenza due to other identified influenza virus with other respiratory manifestations: Secondary | ICD-10-CM | POA: Diagnosis not present

## 2024-07-13 LAB — CBC WITH DIFFERENTIAL/PLATELET
Abs Immature Granulocytes: 0.05 K/uL (ref 0.00–0.07)
Basophils Absolute: 0 K/uL (ref 0.0–0.1)
Basophils Relative: 0 %
Eosinophils Absolute: 0 K/uL (ref 0.0–0.5)
Eosinophils Relative: 0 %
HCT: 46.4 % (ref 39.0–52.0)
Hemoglobin: 15.9 g/dL (ref 13.0–17.0)
Immature Granulocytes: 1 %
Lymphocytes Relative: 18 %
Lymphs Abs: 0.9 K/uL (ref 0.7–4.0)
MCH: 32.9 pg (ref 26.0–34.0)
MCHC: 34.3 g/dL (ref 30.0–36.0)
MCV: 96.1 fL (ref 80.0–100.0)
Monocytes Absolute: 1 K/uL (ref 0.1–1.0)
Monocytes Relative: 18 %
Neutro Abs: 3.3 K/uL (ref 1.7–7.7)
Neutrophils Relative %: 63 %
Platelets: 180 K/uL (ref 150–400)
RBC: 4.83 MIL/uL (ref 4.22–5.81)
RDW: 13.3 % (ref 11.5–15.5)
WBC Morphology: >10
WBC: 5.2 K/uL (ref 4.0–10.5)
nRBC: 0 % (ref 0.0–0.2)

## 2024-07-13 LAB — COMPREHENSIVE METABOLIC PANEL WITH GFR
ALT: 44 U/L (ref 0–44)
AST: 85 U/L — ABNORMAL HIGH (ref 15–41)
Albumin: 3.4 g/dL — ABNORMAL LOW (ref 3.5–5.0)
Alkaline Phosphatase: 41 U/L (ref 38–126)
Anion gap: 9 (ref 5–15)
BUN: 20 mg/dL (ref 8–23)
CO2: 30 mmol/L (ref 22–32)
Calcium: 8.8 mg/dL — ABNORMAL LOW (ref 8.9–10.3)
Chloride: 99 mmol/L (ref 98–111)
Creatinine, Ser: 0.8 mg/dL (ref 0.61–1.24)
GFR, Estimated: >60 mL/min
Glucose, Bld: 155 mg/dL — ABNORMAL HIGH (ref 70–99)
Potassium: 4 mmol/L (ref 3.5–5.1)
Sodium: 138 mmol/L (ref 135–145)
Total Bilirubin: 1 mg/dL (ref 0.0–1.2)
Total Protein: 7.6 g/dL (ref 6.5–8.1)

## 2024-07-13 LAB — C-REACTIVE PROTEIN: CRP: 1.5 mg/dL — ABNORMAL HIGH

## 2024-07-13 LAB — MAGNESIUM: Magnesium: 2.6 mg/dL — ABNORMAL HIGH (ref 1.7–2.4)

## 2024-07-13 LAB — PRO BRAIN NATRIURETIC PEPTIDE: Pro Brain Natriuretic Peptide: <50 pg/mL

## 2024-07-13 LAB — PHOSPHORUS: Phosphorus: 2.5 mg/dL (ref 2.5–4.6)

## 2024-07-13 MED ORDER — FUROSEMIDE 20 MG PO TABS
20.0000 mg | ORAL_TABLET | Freq: Every day | ORAL | Status: DC
  Administered 2024-07-13 – 2024-07-16 (×4): 20 mg via ORAL
  Filled 2024-07-13 (×4): qty 1
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
## 2024-07-13 NOTE — Progress Notes (Signed)
 PT Cancellation Note  Patient Details Name: John Mcguire. MRN: [id number] DOB: 11-08-1960   Cancelled Treatment:    Reason Eval/Treat Not Completed: Patient declined, no reason specified. PT attempted x2 today, pt refusing and stating this is what I do at home, I lay in bed. I don't start moving until the sun goes down. Offered education to pt on benefits of OOB mobility to improve strength and activity tolerance, and therapy staff only being present to provide treatment during the day time. PT will continue to attempt treatment.  Isaiah DEL. Lorelie Biermann, PT, DPT   [employer] 07/13/2024, 3:00 PM

## 2024-07-13 NOTE — Plan of Care (Signed)

## 2024-07-14 DIAGNOSIS — J101 Influenza due to other identified influenza virus with other respiratory manifestations: Secondary | ICD-10-CM | POA: Diagnosis not present

## 2024-07-14 LAB — CBC WITH DIFFERENTIAL/PLATELET
Abs Immature Granulocytes: 0.06 K/uL (ref 0.00–0.07)
Basophils Absolute: 0 K/uL (ref 0.0–0.1)
Basophils Relative: 0 %
Eosinophils Absolute: 0 K/uL (ref 0.0–0.5)
Eosinophils Relative: 0 %
HCT: 43.8 % (ref 39.0–52.0)
Hemoglobin: 14.7 g/dL (ref 13.0–17.0)
Immature Granulocytes: 1 %
Lymphocytes Relative: 14 %
Lymphs Abs: 1 K/uL (ref 0.7–4.0)
MCH: 32.3 pg (ref 26.0–34.0)
MCHC: 33.6 g/dL (ref 30.0–36.0)
MCV: 96.3 fL (ref 80.0–100.0)
Monocytes Absolute: 0.9 K/uL (ref 0.1–1.0)
Monocytes Relative: 13 %
Neutro Abs: 5.2 K/uL (ref 1.7–7.7)
Neutrophils Relative %: 72 %
Platelets: 173 K/uL (ref 150–400)
RBC: 4.55 MIL/uL (ref 4.22–5.81)
RDW: 13.2 % (ref 11.5–15.5)
Smear Review: NORMAL
WBC: 7.2 K/uL (ref 4.0–10.5)
nRBC: 0 % (ref 0.0–0.2)

## 2024-07-14 LAB — CULTURE, BLOOD (ROUTINE X 2)
Culture: NO GROWTH
Special Requests: ADEQUATE

## 2024-07-14 LAB — COMPREHENSIVE METABOLIC PANEL WITH GFR
ALT: 43 U/L (ref 0–44)
AST: 65 U/L — ABNORMAL HIGH (ref 15–41)
Albumin: 3.2 g/dL — ABNORMAL LOW (ref 3.5–5.0)
Alkaline Phosphatase: 40 U/L (ref 38–126)
Anion gap: 8 (ref 5–15)
BUN: 21 mg/dL (ref 8–23)
CO2: 32 mmol/L (ref 22–32)
Calcium: 8.8 mg/dL — ABNORMAL LOW (ref 8.9–10.3)
Chloride: 99 mmol/L (ref 98–111)
Creatinine, Ser: 0.77 mg/dL (ref 0.61–1.24)
GFR, Estimated: >60 mL/min
Glucose, Bld: 201 mg/dL — ABNORMAL HIGH (ref 70–99)
Potassium: 3.7 mmol/L (ref 3.5–5.1)
Sodium: 139 mmol/L (ref 135–145)
Total Bilirubin: 0.9 mg/dL (ref 0.0–1.2)
Total Protein: 7.1 g/dL (ref 6.5–8.1)

## 2024-07-14 LAB — MAGNESIUM: Magnesium: 2.5 mg/dL — ABNORMAL HIGH (ref 1.7–2.4)

## 2024-07-14 LAB — PHOSPHORUS: Phosphorus: 2.6 mg/dL (ref 2.5–4.6)

## 2024-07-14 LAB — C-REACTIVE PROTEIN: CRP: 0.9 mg/dL

## 2024-07-14 MED ORDER — K PHOS MONO-SOD PHOS DI & MONO 155-852-130 MG PO TABS
500.0000 mg | ORAL_TABLET | Freq: Two times a day (BID) | ORAL | Status: AC
  Administered 2024-07-14 (×2): 500 mg via ORAL
  Filled 2024-07-14 (×2): qty 2

## 2024-07-14 MED ORDER — MUPIROCIN 2 % EX OINT
TOPICAL_OINTMENT | CUTANEOUS | Status: DC
  Filled 2024-07-14: qty 22

## 2024-07-14 NOTE — Plan of Care (Signed)
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
## 2024-07-14 NOTE — Plan of Care (Signed)
" °  Problem: Health Behavior/Discharge Planning: Goal: Ability to manage health-related needs will improve Outcome: Progressing   Problem: Clinical Measurements: Goal: Will remain free from infection Outcome: Progressing Goal: Diagnostic test results will improve Outcome: Progressing Goal: Respiratory complications will improve Outcome: Progressing   Problem: Activity: Goal: Risk for activity intolerance will decrease Outcome: Progressing   Problem: Skin Integrity: Goal: Risk for impaired skin integrity will decrease Outcome: Progressing   "

## 2024-07-14 NOTE — Progress Notes (Signed)
 Patient requested for wound care nurse to have the dressing change in the morning.
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
## 2024-07-14 NOTE — Progress Notes (Signed)
" °   07/14/24 0000  BiPAP/CPAP/SIPAP  Reason BIPAP/CPAP not in use Non-compliant (Pt. refused. Pt. educated on why to wear maching.)  BiPAP/CPAP /SiPAP Vitals  Bilateral Breath Sounds Rhonchi    "

## 2024-07-15 ENCOUNTER — Other Ambulatory Visit (HOSPITAL_COMMUNITY): Payer: Self-pay

## 2024-07-15 DIAGNOSIS — J101 Influenza due to other identified influenza virus with other respiratory manifestations: Secondary | ICD-10-CM | POA: Diagnosis not present

## 2024-07-15 LAB — CBC WITH DIFFERENTIAL/PLATELET
Basophils Absolute: 0 K/uL (ref 0.0–0.1)
Basophils Relative: 0 %
Eosinophils Absolute: 0 K/uL (ref 0.0–0.5)
Eosinophils Relative: 0 %
HCT: 41.8 % (ref 39.0–52.0)
Hemoglobin: 14.3 g/dL (ref 13.0–17.0)
Lymphocytes Relative: 10 %
Lymphs Abs: 0.8 K/uL (ref 0.7–4.0)
MCH: 32.8 pg (ref 26.0–34.0)
MCHC: 34.2 g/dL (ref 30.0–36.0)
MCV: 95.9 fL (ref 80.0–100.0)
Monocytes Absolute: 1.3 K/uL — ABNORMAL HIGH (ref 0.1–1.0)
Monocytes Relative: 16 %
Neutro Abs: 6.2 K/uL (ref 1.7–7.7)
Neutrophils Relative %: 74 %
Platelets: 173 K/uL (ref 150–400)
RBC: 4.36 MIL/uL (ref 4.22–5.81)
RDW: 13.2 % (ref 11.5–15.5)
WBC: 8.4 K/uL (ref 4.0–10.5)
nRBC: 0 % (ref 0.0–0.2)

## 2024-07-15 LAB — COMPREHENSIVE METABOLIC PANEL WITH GFR
ALT: 54 U/L — ABNORMAL HIGH (ref 0–44)
AST: 54 U/L — ABNORMAL HIGH (ref 15–41)
Albumin: 3.2 g/dL — ABNORMAL LOW (ref 3.5–5.0)
Alkaline Phosphatase: 41 U/L (ref 38–126)
Anion gap: 9 (ref 5–15)
BUN: 19 mg/dL (ref 8–23)
CO2: 30 mmol/L (ref 22–32)
Calcium: 8.7 mg/dL — ABNORMAL LOW (ref 8.9–10.3)
Chloride: 101 mmol/L (ref 98–111)
Creatinine, Ser: 0.68 mg/dL (ref 0.61–1.24)
GFR, Estimated: >60 mL/min
Glucose, Bld: 275 mg/dL — ABNORMAL HIGH (ref 70–99)
Potassium: 3.4 mmol/L — ABNORMAL LOW (ref 3.5–5.1)
Sodium: 140 mmol/L (ref 135–145)
Total Bilirubin: 0.9 mg/dL (ref 0.0–1.2)
Total Protein: 6.6 g/dL (ref 6.5–8.1)

## 2024-07-15 LAB — C-REACTIVE PROTEIN: CRP: 0.5 mg/dL

## 2024-07-15 LAB — PHOSPHORUS: Phosphorus: 2.7 mg/dL (ref 2.5–4.6)

## 2024-07-15 LAB — MAGNESIUM: Magnesium: 2.4 mg/dL (ref 1.7–2.4)

## 2024-07-15 MED ORDER — POTASSIUM CHLORIDE CRYS ER 20 MEQ PO TBCR
30.0000 meq | EXTENDED_RELEASE_TABLET | Freq: Four times a day (QID) | ORAL | Status: AC
  Administered 2024-07-15 (×2): 30 meq via ORAL
  Filled 2024-07-15 (×2): qty 1

## 2024-07-15 NOTE — Progress Notes (Signed)
 PTS SATS 87% ON ROOM AIR JUST MOVING IN THE BED. I REAPPLIED O2 @ 2L VIA NASAL CANNULA AND NOTIFIED DR ELGERGAWY.
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
## 2024-07-15 NOTE — Plan of Care (Signed)

## 2024-07-15 NOTE — Progress Notes (Signed)
 PTS DRESSING CHANGED ON HIS RIGHT LOWER LEG. I REMOVED OLD DRESSING AND CLEANSED WITH VASH. APPLIED MUPIROCIN  AND AQUACEL AG DRESSING, ABD PAD, WRAPPED WITH KERLIX AND ACE WRAPS FROM TOES TO UNDER KNEE.

## 2024-07-15 NOTE — Plan of Care (Signed)
  Problem: Clinical Measurements: Goal: Ability to maintain clinical measurements within normal limits will improve Outcome: Progressing Goal: Will remain free from infection Outcome: Progressing Goal: Respiratory complications will improve Outcome: Progressing   Problem: Activity: Goal: Risk for activity intolerance will decrease Outcome: Progressing   Problem: Safety: Goal: Ability to remain free from injury will improve Outcome: Progressing

## 2024-07-15 NOTE — TOC Progression Note (Signed)
 Transition of Care (TOC) - Progression Note    Patient Details  Name: John Mcguire. MRN: [id number] Date of Birth: 03-21-1961  [facility]) CM/SW Contact  Luann SHAUNNA [place], [place] Phone Number: 07/15/2024, 11:43 AM  Clinical Narrative:     CSW met with pt to discuss SNF recommendations. Pt not interested in SNF; prefers to go home with [facility]. He confirmed address listed in chart. States he lives alone. Uses a cane. CSW notified RNCM and MD of plan for home DC.  Expected Discharge Plan: Home w Home Health Services Barriers to Discharge: Continued Medical Work up               Expected Discharge Plan and Services       Living arrangements for the past 2 months: Apartment                                       Social Drivers of Health (SDOH) Interventions SDOH Screenings   Food Insecurity: No Food Insecurity (07/10/2024)  Housing: Low Risk (07/10/2024)  Transportation Needs: Unmet Transportation Needs (07/10/2024)  Utilities: Not At Risk (07/10/2024)  Depression (PHQ2-9): Low Risk (11/17/2022)  Social Connections: Unknown (11/17/2022)  Tobacco Use: Medium Risk (07/09/2024)    Readmission Risk Interventions     No data to display

## 2024-07-16 ENCOUNTER — Other Ambulatory Visit (HOSPITAL_COMMUNITY): Payer: Self-pay

## 2024-07-16 DIAGNOSIS — J101 Influenza due to other identified influenza virus with other respiratory manifestations: Secondary | ICD-10-CM | POA: Diagnosis not present

## 2024-07-16 LAB — COMPREHENSIVE METABOLIC PANEL WITH GFR
ALT: 90 U/L — ABNORMAL HIGH (ref 0–44)
AST: 78 U/L — ABNORMAL HIGH (ref 15–41)
Albumin: 3.1 g/dL — ABNORMAL LOW (ref 3.5–5.0)
Alkaline Phosphatase: 42 U/L (ref 38–126)
Anion gap: 7 (ref 5–15)
BUN: 20 mg/dL (ref 8–23)
CO2: 32 mmol/L (ref 22–32)
Calcium: 8.7 mg/dL — ABNORMAL LOW (ref 8.9–10.3)
Chloride: 104 mmol/L (ref 98–111)
Creatinine, Ser: 0.69 mg/dL (ref 0.61–1.24)
GFR, Estimated: >60 mL/min
Glucose, Bld: 119 mg/dL — ABNORMAL HIGH (ref 70–99)
Potassium: 3.3 mmol/L — ABNORMAL LOW (ref 3.5–5.1)
Sodium: 143 mmol/L (ref 135–145)
Total Bilirubin: 0.9 mg/dL (ref 0.0–1.2)
Total Protein: 6.6 g/dL (ref 6.5–8.1)

## 2024-07-16 LAB — CBC WITH DIFFERENTIAL/PLATELET
Basophils Absolute: 0 K/uL (ref 0.0–0.1)
Basophils Relative: 0 %
Eosinophils Absolute: 0 K/uL (ref 0.0–0.5)
Eosinophils Relative: 0 %
HCT: 45.7 % (ref 39.0–52.0)
Hemoglobin: 15.4 g/dL (ref 13.0–17.0)
Lymphocytes Relative: 22 %
Lymphs Abs: 1.5 K/uL (ref 0.7–4.0)
MCH: 32.8 pg (ref 26.0–34.0)
MCHC: 33.7 g/dL (ref 30.0–36.0)
MCV: 97.4 fL (ref 80.0–100.0)
Monocytes Absolute: 0.3 K/uL (ref 0.1–1.0)
Monocytes Relative: 5 %
Neutro Abs: 4.9 K/uL (ref 1.7–7.7)
Neutrophils Relative %: 73 %
Platelets: 187 K/uL (ref 150–400)
RBC: 4.69 MIL/uL (ref 4.22–5.81)
RDW: 13.4 % (ref 11.5–15.5)
WBC: 6.7 K/uL (ref 4.0–10.5)
nRBC: 0 % (ref 0.0–0.2)

## 2024-07-16 MED ORDER — PREDNISONE 10 MG (21) PO TBPK
ORAL_TABLET | ORAL | 0 refills | Status: AC
  Filled 2024-07-16: qty 21, 6d supply, fill #0

## 2024-07-16 MED ORDER — THIAMINE HCL 100 MG PO TABS
100.0000 mg | ORAL_TABLET | Freq: Every day | ORAL | 0 refills | Status: AC
  Filled 2024-07-16: qty 30, 30d supply, fill #0

## 2024-07-16 MED ORDER — DILTIAZEM HCL ER COATED BEADS 120 MG PO CP24
120.0000 mg | ORAL_CAPSULE | Freq: Every day | ORAL | 0 refills | Status: AC
  Filled 2024-07-16: qty 30, 30d supply, fill #0

## 2024-07-16 MED ORDER — FUROSEMIDE 20 MG PO TABS
20.0000 mg | ORAL_TABLET | Freq: Every day | ORAL | 0 refills | Status: AC
  Filled 2024-07-16: qty 30, 30d supply, fill #0

## 2024-07-16 MED ORDER — PANTOPRAZOLE SODIUM 40 MG PO TBEC
40.0000 mg | DELAYED_RELEASE_TABLET | Freq: Every day | ORAL | 0 refills | Status: AC
  Filled 2024-07-16: qty 30, 30d supply, fill #0

## 2024-07-16 MED ORDER — ADULT MULTIVITAMIN W/MINERALS CH
1.0000 | ORAL_TABLET | Freq: Every day | ORAL | 0 refills | Status: AC
  Filled 2024-07-16: qty 30, 30d supply, fill #0

## 2024-07-16 NOTE — Discharge Instructions (Signed)
 Follow with Primary MD Lenon Nell SAILOR, FNP in 7 days   Get CBC, CMP,  Chest X ray checked  by Primary MD next visit.    Activity: As tolerated with Full fall precautions use walker/cane & assistance as needed   Disposition Home    Diet: Heart Healthy    On your next visit with your primary care physician please Get Medicines reviewed and adjusted.   Please request your Prim.MD to go over all Hospital Tests and Procedure/Radiological results at the follow up, please get all Hospital records sent to your Prim MD by signing hospital release before you go home.   If you experience worsening of your admission symptoms, develop shortness of breath, life threatening emergency, suicidal or homicidal thoughts you must seek medical attention immediately by calling 911 or calling your MD immediately  if symptoms less severe.  You Must read complete instructions/literature along with all the possible adverse reactions/side effects for all the Medicines you take and that have been prescribed to you. Take any new Medicines after you have completely understood and accpet all the possible adverse reactions/side effects.   Do not drive, operating heavy machinery, perform activities at heights, swimming or participation in water  activities or provide baby sitting services if your were admitted for syncope or siezures until you have seen by Primary MD or a Neurologist and advised to do so again.  Do not drive when taking Pain medications.    Do not take more than prescribed Pain, Sleep and Anxiety Medications  Special Instructions: If you have smoked or chewed Tobacco  in the last 2 yrs please stop smoking, stop any regular Alcohol  and or any Recreational drug use.  Wear Seat belts while driving.   Please note  You were cared for by a hospitalist during your hospital stay. If you have any questions about your discharge medications or the care you received while you were in the hospital after  you are discharged, you can call the unit and asked to speak with the hospitalist on call if the hospitalist that took care of you is not available. Once you are discharged, your primary care physician will handle any further medical issues. Please note that NO REFILLS for any discharge medications will be authorized once you are discharged, as it is imperative that you return to your primary care physician (or establish a relationship with a primary care physician if you do not have one) for your aftercare needs so that they can reassess your need for medications and monitor your lab values.

## 2024-07-16 NOTE — Progress Notes (Incomplete)
 John Mcguire

## 2024-07-16 NOTE — Plan of Care (Signed)
" °  Problem: Education: Goal: Knowledge of General Education information will improve Description: Including pain rating scale, medication(s)/side effects and non-pharmacologic comfort measures Outcome: Progressing   Problem: Health Behavior/Discharge Planning: Goal: Ability to manage health-related needs will improve Outcome: Progressing   Problem: Clinical Measurements: Goal: Ability to maintain clinical measurements within normal limits will improve Outcome: Progressing Goal: Will remain free from infection Outcome: Progressing Goal: Diagnostic test results will improve Outcome: Progressing Goal: Respiratory complications will improve Outcome: Progressing Goal: Cardiovascular complication will be avoided Outcome: Progressing   Problem: Elimination: Goal: Will not experience complications related to bowel motility Outcome: Progressing Goal: Will not experience complications related to urinary retention Outcome: Progressing   Problem: Coping: Goal: Level of anxiety will decrease Outcome: Progressing   Problem: Pain Managment: Goal: General experience of comfort will improve and/or be controlled Outcome: Progressing   Problem: Safety: Goal: Ability to remain free from injury will improve Outcome: Progressing   "

## 2024-07-16 NOTE — Plan of Care (Signed)

## 2024-07-16 NOTE — TOC Transition Note (Addendum)
 Transition of Care [facility]) - Discharge Note   Patient Details  Name: John Mcguire. MRN: [id number] Date of Birth: 1961/06/22  Transition of Care [facility]) CM/SW Contact:  Marval Gell, RN Phone Number: 07/16/2024, 12:09 PM   Clinical Narrative:     Spoke to patient in room. He states that he is wanting to go home. He states that he has a friend that he is going to call for a ride home. He is agreeable to [facility] services, does not have a preference for a provider and referral made to [facility].  Awaiting ambulatory sats to order home oxygen.    13:40 requested home oxygen through [employer] to be delivered to the room. Patient states his ride will be here after 4pm   Final next level of care: Home w Home Health Services Barriers to Discharge: No Barriers Identified   Patient Goals and CMS Choice Patient states their goals for this hospitalization and ongoing recovery are:: Wants to go home with [facility]          Discharge Placement                       Discharge Plan and Services Additional resources added to the After Visit Summary for                            [facility] Arranged: PT, RN [facility] Agency: [facility] Date [facility] Agency Contacted: 07/16/24 Time HH Agency Contacted: [id number] Representative spoke with at [facility] Agency: Amy  Social Drivers of Health (SDOH) Interventions SDOH Screenings   Food Insecurity: No Food Insecurity (07/10/2024)  Housing: Low Risk (07/10/2024)  Transportation Needs: Unmet Transportation Needs (07/10/2024)  Utilities: Not At Risk (07/10/2024)  Depression (PHQ2-9): Low Risk (11/17/2022)  Social Connections: Unknown (11/17/2022)  Tobacco Use: Medium Risk (07/09/2024)     Readmission Risk Interventions     No data to display

## 2024-07-16 NOTE — Progress Notes (Signed)
SATURATION QUALIFICATIONS: (This note is used to comply with regulatory documentation for home oxygen)  Patient Saturations on Room Air at Rest = 89%  Patient Saturations on Room Air while Ambulating = 86%  Patient Saturations on 2  Liters of oxygen while Ambulating = 90%  Please briefly explain why patient needs home oxygen: 

## 2024-07-16 NOTE — Discharge Summary (Signed)
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     Status: Abnormal   Collection Time: 07/09/24  5:15 PM   Specimen: Anterior Nasal Swab  Result Value Ref Range Status   SARS Coronavirus 2 by RT PCR NEGATIVE NEGATIVE Final   Influenza A by PCR POSITIVE (A) NEGATIVE Final   Influenza B by PCR NEGATIVE NEGATIVE Final    Comment: (NOTE) The Xpert Xpress SARS-CoV-2/FLU/RSV plus assay is intended as an aid in the diagnosis of influenza from Nasopharyngeal swab specimens and should not be used as a sole basis for treatment. Nasal washings and aspirates are unacceptable for Xpert Xpress SARS-CoV-2/FLU/RSV testing.  Fact Sheet for  Patients: bloggercourse.com  Fact Sheet for Healthcare Providers: seriousbroker.it  This test is not yet approved or cleared by the [place]  FDA and has been authorized for detection and/or diagnosis of SARS-CoV-2 by FDA under an Emergency Use Authorization (EUA). This EUA will remain in effect (meaning this test can be used) for the duration of the COVID-19 declaration under Section 564(b)(1) of the Act, 21 U.S.C. section 360bbb-3(b)(1), unless the authorization is terminated or revoked.     Resp Syncytial Virus by PCR NEGATIVE NEGATIVE Final    Comment: (NOTE) Fact Sheet for Patients: bloggercourse.com  Fact Sheet for Healthcare Providers: seriousbroker.it  This test is not yet approved or cleared by the [place]  FDA  and has been authorized for detection and/or diagnosis of SARS-CoV-2 by FDA under an Emergency Use Authorization (EUA). This EUA will remain in effect (meaning this test can be used) for the duration of the COVID-19 declaration under Section 564(b)(1) of the Act, 21 U.S.C. section 360bbb-3(b)(1), unless the authorization is terminated or revoked.  Performed at [facility], [address], [place], [place] [address]   Blood culture (routine x 2)     Status: None   Collection Time: 07/09/24  5:24 PM   Specimen: BLOOD  Result Value Ref Range Status   Specimen Description BLOOD RIGHT ANTECUBITAL  Final   Special Requests   Final    BOTTLES DRAWN AEROBIC AND ANAEROBIC Blood Culture adequate volume   Culture   Final    NO GROWTH 5 DAYS Performed at [facility], [address] [place], [place] [address]    Report Status 07/14/2024 FINAL  Final  Blood culture (routine x 2)     Status: Abnormal   Collection Time: 07/09/24  5:29 PM   Specimen: BLOOD  Result Value Ref Range Status   Specimen Description BLOOD LEFT ANTECUBITAL  Final    Special Requests   Final    BOTTLES DRAWN AEROBIC AND ANAEROBIC Blood Culture results may not be optimal due to an inadequate volume of blood received in culture bottles   Culture  Setup Time   Final    GRAM POSITIVE RODS AEROBIC BOTTLE ONLY CRITICAL RESULT CALLED TO, READ BACK BY AND VERIFIED WITH: MIRANDA VBRYK PHARM D 07/12/2024 @0306  BY DD    Culture (A)  Final    CORYNEBACTERIUM UREALYTICUM Standardized susceptibility testing for this organism is not available. Performed at [facility], [address], [place], [place] [address]    Report Status 07/14/2024 FINAL  Final     Time coordinating discharge: Over 30 minutes  SIGNED:   Brayton Lye, MD  [facility] 07/16/2024, 11:19 AM Pager   If 7PM-7AM, please contact night-coverage [email or web address]     [1]  Allergies Allergen Reactions   Betadine [Povidone Iodine] Rash

## 2024-07-19 ENCOUNTER — Encounter (HOSPITAL_BASED_OUTPATIENT_CLINIC_OR_DEPARTMENT_OTHER): Attending: General Surgery | Admitting: General Surgery

## 2024-07-19 ENCOUNTER — Other Ambulatory Visit: Payer: Self-pay

## 2024-07-19 DIAGNOSIS — I70213 Atherosclerosis of native arteries of extremities with intermittent claudication, bilateral legs: Secondary | ICD-10-CM

## 2024-07-26 ENCOUNTER — Encounter (HOSPITAL_BASED_OUTPATIENT_CLINIC_OR_DEPARTMENT_OTHER): Attending: General Surgery | Admitting: General Surgery

## 2024-07-26 DIAGNOSIS — F17210 Nicotine dependence, cigarettes, uncomplicated: Secondary | ICD-10-CM | POA: Diagnosis not present

## 2024-07-26 DIAGNOSIS — L97519 Non-pressure chronic ulcer of other part of right foot with unspecified severity: Secondary | ICD-10-CM | POA: Diagnosis present

## 2024-07-26 DIAGNOSIS — Z86718 Personal history of other venous thrombosis and embolism: Secondary | ICD-10-CM | POA: Diagnosis not present

## 2024-07-26 DIAGNOSIS — Z945 Skin transplant status: Secondary | ICD-10-CM | POA: Insufficient documentation

## 2024-07-26 DIAGNOSIS — I872 Venous insufficiency (chronic) (peripheral): Secondary | ICD-10-CM | POA: Diagnosis not present

## 2024-07-26 DIAGNOSIS — L97819 Non-pressure chronic ulcer of other part of right lower leg with unspecified severity: Secondary | ICD-10-CM | POA: Diagnosis not present

## 2024-07-26 DIAGNOSIS — Z7901 Long term (current) use of anticoagulants: Secondary | ICD-10-CM | POA: Diagnosis not present

## 2024-07-26 DIAGNOSIS — Z86711 Personal history of pulmonary embolism: Secondary | ICD-10-CM | POA: Insufficient documentation

## 2024-08-03 ENCOUNTER — Encounter (HOSPITAL_BASED_OUTPATIENT_CLINIC_OR_DEPARTMENT_OTHER): Admitting: General Surgery

## 2024-08-03 DIAGNOSIS — L97519 Non-pressure chronic ulcer of other part of right foot with unspecified severity: Secondary | ICD-10-CM | POA: Diagnosis not present

## 2024-08-10 ENCOUNTER — Encounter (HOSPITAL_BASED_OUTPATIENT_CLINIC_OR_DEPARTMENT_OTHER): Admitting: General Surgery

## 2024-08-17 ENCOUNTER — Encounter (HOSPITAL_BASED_OUTPATIENT_CLINIC_OR_DEPARTMENT_OTHER): Admitting: General Surgery

## 2024-08-18 NOTE — Progress Notes (Unsigned)
" ° °  Patient ID: John Mcguire., male   DOB: Nov 27, 1960, 64 y.o.   MRN: [id number]  Reason for Consult: No chief complaint on file.   Referred by Lenon Nell SAILOR, FNP  Subjective:     HPI John Mcguire. is a 64 y.o. male presenting for evaluation of right leg wounds.  He has been being followed by the wound care clinic. ***  Past Medical History:  Diagnosis Date   pulmonary embolism    No family history on file. No past surgical history on file.  Short Social History:  Social History   Tobacco Use   Smoking status: Former    Current packs/day: 0.00    Average packs/day: 1 pack/day for 39.0 years (39.0 ttl pk-yrs)    Types: Cigarettes    Start date: 10/17/1974    Quit date: 10/19/2013    Years since quitting: 10.8    Passive exposure: Never   Smokeless tobacco: Never  Substance Use Topics   Alcohol use: Yes    Alcohol/week: 9.0 standard drinks of alcohol    Types: 3 Cans of beer, 6 Shots of liquor per week    Allergies[1]  Current Outpatient Medications  Medication Sig Dispense Refill   apixaban  (ELIQUIS ) 5 MG TABS tablet Take 1 tablet (5 mg total) by mouth 2 (two) times daily. 90 tablet 1   diltiazem  (CARDIZEM  CD) 120 MG 24 hr capsule Take 1 capsule (120 mg total) by mouth daily. 30 capsule 0   furosemide  (LASIX ) 20 MG tablet Take 1 tablet (20 mg total) by mouth daily. 30 tablet 0   Multiple Vitamin (MULTIVITAMIN WITH MINERALS) TABS tablet Take 1 tablet by mouth daily. 30 tablet 0   pantoprazole  (PROTONIX ) 40 MG tablet Take 1 tablet (40 mg total) by mouth daily. 30 tablet 0   predniSONE  (STERAPRED UNI-PAK 21 TAB) 10 MG (21) TBPK tablet Use per package instructions 21 tablet 0   QUEtiapine  (SEROQUEL ) 100 MG tablet TAKE 1 TABLET (100 MG TOTAL) BY MOUTH AT BEDTIME. FOLLOW UP WITH YOUR DOCTOR BEFORE THE NEXT REFILL. 30 tablet 0   thiamine  (VITAMIN B1) 100 MG tablet Take 1 tablet (100 mg total) by mouth daily. 30 tablet 0   No current  facility-administered medications for this visit.    REVIEW OF SYSTEMS  All other systems were reviewed and are negative     Objective:  Objective   There were no vitals filed for this visit. There is no height or weight on file to calculate BMI.  Physical Exam General: no acute distress Cardiac: hemodynamically stable Abdomen: non-tender, no pulsatile mass*** Extremities: no edema, cyanosis or wounds*** Vascular:   Right: ***  Left: ***  Data: ABI ***  Echo reviewed EF 55 to 60%, normal LV function, grade 1 diastolic dysfunction, RV function normal, no valvular abnormalities  CMP reviewed, creatinine 0.69     Assessment/Plan:   John Mcguire. is a 64 y.o. male with ***  Recommendations to optimize cardiovascular risk: Abstinence from all tobacco products. Blood glucose control with goal A1c < 7%. Blood pressure control with goal blood pressure < 140/90 mmHg. Lipid reduction therapy with goal LDL-C <55 mg/dL  Aspirin 81mg  PO QD.  Atorvastatin 40-80mg  PO QD (or other high intensity statin therapy).   John GORMAN Serve MD [facility]     [1]  Allergies Allergen Reactions   Betadine [Povidone Iodine] Rash   "

## 2024-08-19 ENCOUNTER — Ambulatory Visit (HOSPITAL_COMMUNITY)

## 2024-08-19 ENCOUNTER — Ambulatory Visit (HOSPITAL_COMMUNITY): Admitting: Vascular Surgery

## 2024-08-29 ENCOUNTER — Encounter (HOSPITAL_BASED_OUTPATIENT_CLINIC_OR_DEPARTMENT_OTHER): Admitting: General Surgery

## 2024-09-29 ENCOUNTER — Ambulatory Visit (HOSPITAL_COMMUNITY)

## 2024-09-29 ENCOUNTER — Encounter: Admitting: Vascular Surgery
